# Patient Record
Sex: Female | Born: 1975 | Race: Black or African American | Hispanic: No | Marital: Single | Smoking: Never smoker
Health system: Southern US, Community
[De-identification: ages and names within clinical notes are randomized; demographics above are authoritative.]

## PROBLEM LIST (undated history)

## (undated) DIAGNOSIS — IMO0001 Reserved for inherently not codable concepts without codable children: Secondary | ICD-10-CM

## (undated) DIAGNOSIS — E559 Vitamin D deficiency, unspecified: Secondary | ICD-10-CM

## (undated) DIAGNOSIS — L680 Hirsutism: Secondary | ICD-10-CM

## (undated) DIAGNOSIS — E538 Deficiency of other specified B group vitamins: Secondary | ICD-10-CM

## (undated) DIAGNOSIS — N946 Dysmenorrhea, unspecified: Secondary | ICD-10-CM

## (undated) DIAGNOSIS — D649 Anemia, unspecified: Secondary | ICD-10-CM

## (undated) DIAGNOSIS — N920 Excessive and frequent menstruation with regular cycle: Secondary | ICD-10-CM

## (undated) DIAGNOSIS — E669 Obesity, unspecified: Secondary | ICD-10-CM

## (undated) HISTORY — DX: Deficiency of other specified B group vitamins: E53.8

## (undated) HISTORY — DX: Dysmenorrhea, unspecified: N94.6

## (undated) HISTORY — DX: Vitamin D deficiency, unspecified: E55.9

## (undated) HISTORY — DX: Anemia, unspecified: D64.9

## (undated) HISTORY — DX: Hirsutism: L68.0

## (undated) HISTORY — DX: Obesity, unspecified: E66.9

## (undated) HISTORY — DX: Excessive and frequent menstruation with regular cycle: N92.0

## (undated) HISTORY — DX: Reserved for inherently not codable concepts without codable children: IMO0001

## (undated) HISTORY — PX: TONSILLECTOMY: SUR1361

---

## 2006-07-21 ENCOUNTER — Ambulatory Visit: Payer: Self-pay | Admitting: Family Medicine

## 2006-09-10 ENCOUNTER — Ambulatory Visit (HOSPITAL_COMMUNITY): Admission: RE | Admit: 2006-09-10 | Discharge: 2006-09-10 | Payer: Self-pay | Admitting: Obstetrics and Gynecology

## 2006-09-18 ENCOUNTER — Encounter: Admission: RE | Admit: 2006-09-18 | Discharge: 2006-10-28 | Payer: Self-pay | Admitting: Surgery

## 2006-09-24 ENCOUNTER — Ambulatory Visit (HOSPITAL_COMMUNITY): Admission: RE | Admit: 2006-09-24 | Discharge: 2006-09-24 | Payer: Self-pay | Admitting: Surgery

## 2006-11-13 ENCOUNTER — Ambulatory Visit (HOSPITAL_COMMUNITY): Admission: RE | Admit: 2006-11-13 | Discharge: 2006-11-13 | Payer: Self-pay | Admitting: Surgery

## 2007-01-27 HISTORY — PX: GASTRIC BYPASS: SHX52

## 2007-02-03 ENCOUNTER — Encounter: Admission: RE | Admit: 2007-02-03 | Discharge: 2007-05-04 | Payer: Self-pay | Admitting: Surgery

## 2007-02-06 ENCOUNTER — Ambulatory Visit (HOSPITAL_COMMUNITY): Admission: RE | Admit: 2007-02-06 | Discharge: 2007-02-06 | Payer: Self-pay | Admitting: Obstetrics and Gynecology

## 2007-02-16 DIAGNOSIS — Z9884 Bariatric surgery status: Secondary | ICD-10-CM | POA: Insufficient documentation

## 2007-02-17 ENCOUNTER — Inpatient Hospital Stay (HOSPITAL_COMMUNITY): Admission: RE | Admit: 2007-02-17 | Discharge: 2007-02-19 | Payer: Self-pay | Admitting: Surgery

## 2007-02-18 ENCOUNTER — Encounter (INDEPENDENT_AMBULATORY_CARE_PROVIDER_SITE_OTHER): Payer: Self-pay | Admitting: Surgery

## 2007-03-26 ENCOUNTER — Ambulatory Visit: Payer: Self-pay | Admitting: Physician Assistant

## 2007-05-20 ENCOUNTER — Encounter: Admission: RE | Admit: 2007-05-20 | Discharge: 2007-08-18 | Payer: Self-pay | Admitting: Surgery

## 2007-10-15 ENCOUNTER — Encounter: Admission: RE | Admit: 2007-10-15 | Discharge: 2007-11-18 | Payer: Self-pay | Admitting: Surgery

## 2008-12-25 IMAGING — CR DG CHEST 2V
2 series · 2 of 2 positions shown · non-contrast
Comparison: No prior studies are available for comparison purposes.

CLINICAL DATA: Bariatric screening; preoperative respiratory exam.
 CHEST - 2 VIEW:

[view not recorded (1 of 2)]
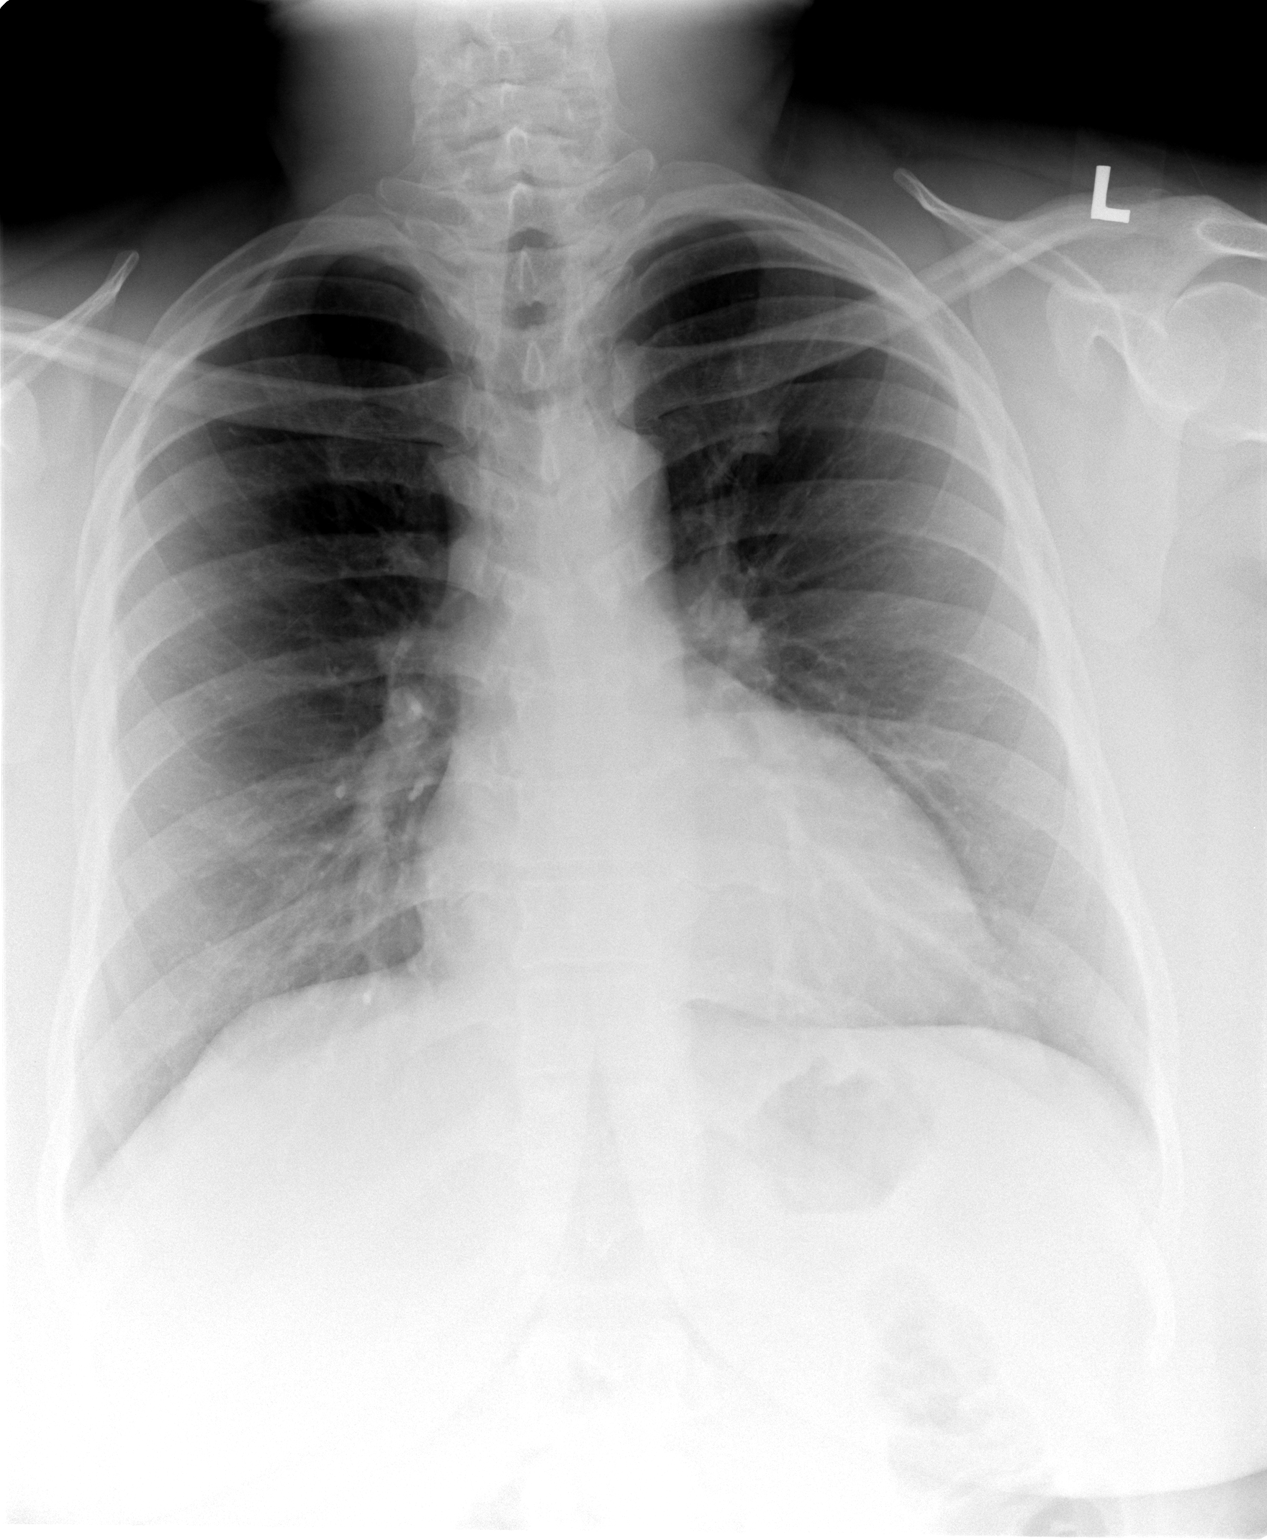

[view not recorded (2 of 2)]
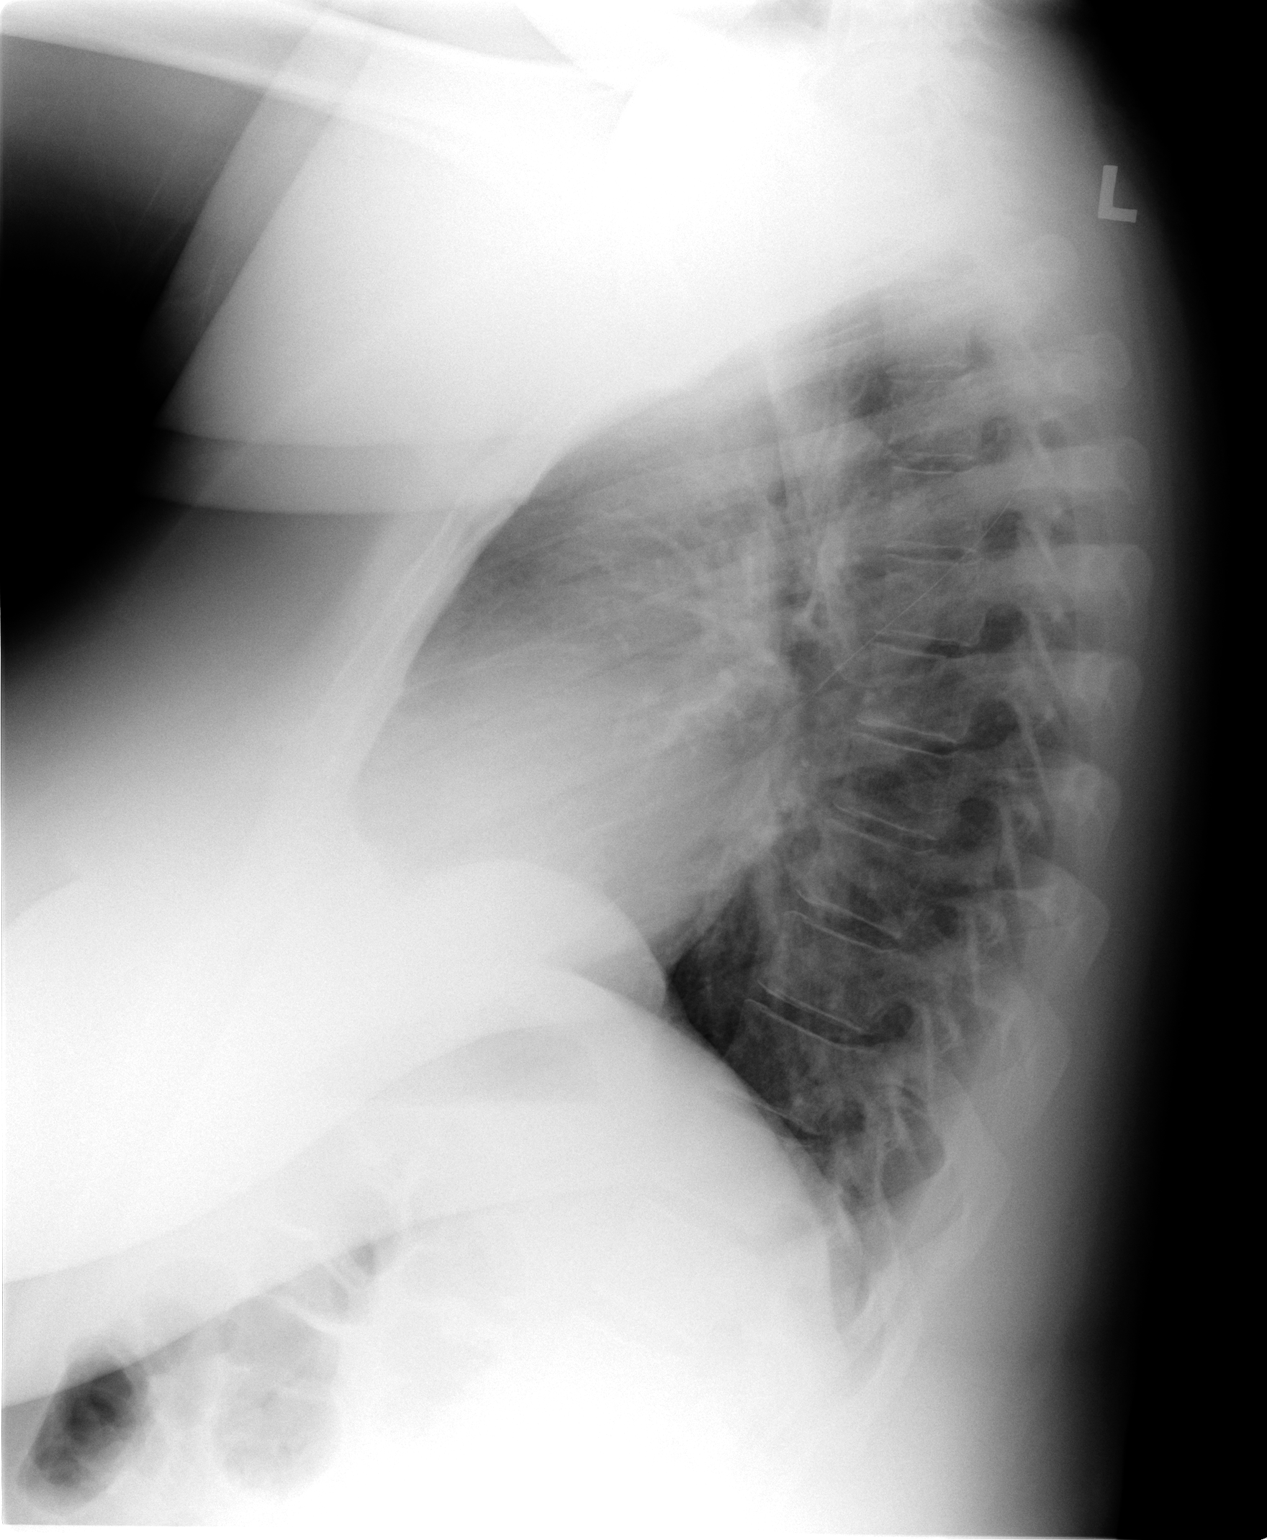

[2 of 2 positions shown; findings below may reference images not displayed]

FINDINGS: The cardiac silhouette, mediastinum, and pulmonary vasculature are within normal limits. Both lungs are clear. Hypertrophic degenerative changes are seen at the junction of the first rib and sternum on the left. The osseous structures are otherwise unremarkable.
IMPRESSION: There is no evidence of acute cardiac or pulmonary process.

## 2010-07-11 NOTE — Op Note (Signed)
NAME:  Jenny Scott, Jenny Scott NO.:  [id number]   MEDICAL RECORD NO.:  [id number]          PATIENT TYPE:  INP   LOCATION:  1523                         FACILITY:  [facility]   PHYSICIAN:  Sandria Bales. Ezzard Standing, M.D.  DATE OF BIRTH:  [birth year]   DATE OF PROCEDURE:  02/17/2007  DATE OF DISCHARGE:                               OPERATIVE REPORT   PREOPERATIVE DIAGNOSIS:  Morbid obesity - weight of 307, BMI of 50.6.   POSTOPERATIVE DIAGNOSIS:  Morbid obesity - weight of 307, BMI of 50.6.   PROCEDURE:  Laparoscopic Roux-en-Y gastric bypass (antecolic,  antegastric), upper endoscopy by Dr. Wenda Low.   SURGEON:  Dr. Ezzard Standing.   FIRST ASSISTANT:  Dr. Wenda Low.   ANESTHESIA:  General endotracheal with approximately 30 mL of 0.25%  Marcaine.   COMPLICATIONS:  None.   PROCEDURE:  Jenny Scott is a 35 year old, black female who is a patient  of Dr. Alba Cory of [place] who has been morbidly obese much of  her adult life. When I initially saw her in July her weight was 307, her  BMI of 50.6. She successfully lost down to a weight of 290 with a BMI of  47.8 in the preoperative time frame.   She is interested in Roux-en-Y gastric bypass and I discussed with her  the indications and potential complications of gastric bypass. The  potential complications include, but are not limited to, bleeding,  infection, bowel leak, deep venous thrombosis and long-term nutritional  consequences.  She also understands the risk of open surgery.   DESCRIPTION OF PROCEDURE:  The patient in a supine position, given a  general endotracheal anesthetic.  She was given 2 grams cefoxitin  at  the initiation of the procedure, had PAS stockings in place, a Foley  catheter in place. Her abdomen was prepped with Betadine solution and  sterilely draped.  A time out was held identifying the patient and  procedure.   I accessed her abdomen with an OptiView Ethicon 11 mm trocar through the  left upper  quadrant.  I then did abdominal exploration. The right and  left lobes of her liver were unremarkable.  She had some of scar tissue  of omentum along the left upper quadrant which I ended up taking down  with scissors. This just appeared to be some scar tissue not related to  any injury or problems. The remainder of her abdomen was unremarkable.  The anterior wall of her stomach was unremarkable except she also had  some funny adhesions between the lesser curvature of the liver which I  also took down with dissection.   I then placed 6 additional trocars, a 5-mm subxiphoid for the liver, a  12-mm right subcostal, a 12-mm right paramedian, an 11-mm to the right  of the umbilicus, an 11-mm left paramedian trocar and a 5-mm left  lateral trocar.   My first identification was to find the small bowel and I found the  ligament of Trietz, count about 40 cm and divided the small bowel with a  white load of the Endo-GIA 45 stapler. I took the  mesentery down also.   I then placed a Penrose drain on the future gastric limb and then  counted 100 cm of small bowel for the bypass portion.   I then did a side-to-side jejunojejunal anastomosis with a 45-mm white  load and then closed the enterotomy with two running 2-0 Vicryl sutures.  I placed an extra buttressing suture. I then closed the mesenteric  defect with a 2-0 silk suture and placed laparo-ties on both ends.   I then placed Tisseel over the jejunojejunostomy.   I then placed the liver retractor retracting the left lobe of liver.  There were adhesions between the undersurface of the liver and the  lesser curvature of the stomach which I took down with sharp  laparoscopic dissection. I then identified the angle of Hiss which I  opened up and then went along the lesser curve approximately 5 cm and  got into a window behind the stomach into the lesser sac.   My first firing to divide the stomach was a 45 Ethicon blue load across  the  stomach. I then did two firings of the 60 mm Ethicon blue load and  then a final firing of the 45 mm Ethicon blue load creating a pouch  approximately 5 cm in length x 3 cm in width.   I had to place a couple clips along the greater curvature on a bleeding  vessel.  I placed some Tisseel along the greater curvature of the new  stomach pouch.  I then oversewed the distal gastric remnant with locking  2-0 Vicryl sutures.   I then sewed the gastric pouch to the jejunal limb that I brought  antecolic and antegastric and did a side to side anastomosis with a  posterior layer of 2-0 Vicryl. Then I put a staple of the 45 mm Ethicon  Blue load trying to create about a 2 cm gastrojejunostomp.  I closed the  enterotomy with two 2-0 Vicryl sutures and then an anterior 2-0 Vicryl  layer up here.   I looked for Peterson's defect but I thought there was little I could  put suture material in there. I thought my anastomosis was under no  tension. Dr. Daphine Deutscher then broke scrub. He went and did an upper  endoscopy.   He will dictate this portion. On endoscopy we looked for no bleeding,  the gastric mucosal lining and the patency of the anastomosis. The  esophagogastric junction was about 40 cm with a gastrojejunal  anastomosis at about 45 cm, so the patient had a 5 cm pouch.   I irrigated the upper abdomen while Dr. Daphine Deutscher did the upper endoscopy  and saw no evidence of any leak from the gastrojejunostomy.   I placed Tisseel over the gastrojejunostomy and Tisseel over the jejunal  little limb.  There was no tension on this anastomosis. All the bowel  lay well and the wounds looked good. I then removed the trocars in turn  after the liver retractor had been removed. I closed the skin at each  site with a 5-0 Monocryl suture, painted each wound with tincture of  Benzoin and steri-stripped it.   I injected about 25 mL of 0.25% Marcaine and the patient tolerated the  procedure well and was transported  to the recovery room in good  condition. Sponge and needle count were correct at the end of the case.  The patient was transferred to the recovery room in good condition.      Sandria Bales.  Ezzard Standing, M.D.  Electronically Signed     DHN/MEDQ  D:  02/17/2007  T:  02/17/2007  Job:  [id number]   cc:   Alba Cory, MD

## 2010-08-16 LAB — HM PAP SMEAR: HM PAP: NORMAL

## 2010-08-16 LAB — LIPID PANEL
CHOLESTEROL: 116 mg/dL (ref 0–200)
HDL: 62 mg/dL (ref 35–70)
LDL CALC: 45 mg/dL
Triglycerides: 45 mg/dL (ref 40–160)

## 2010-08-28 ENCOUNTER — Ambulatory Visit: Payer: Self-pay | Admitting: Oncology

## 2010-09-27 ENCOUNTER — Ambulatory Visit: Payer: Self-pay | Admitting: Oncology

## 2010-12-01 LAB — BASIC METABOLIC PANEL
BUN: 26 — ABNORMAL HIGH
Calcium: 9.7
Chloride: 100
Creatinine, Ser: 0.84
Sodium: 135

## 2010-12-01 LAB — DIFFERENTIAL
Basophils Absolute: 0
Basophils Absolute: 0
Eosinophils Absolute: 0 — ABNORMAL LOW
Eosinophils Absolute: 0 — ABNORMAL LOW
Eosinophils Relative: 0
Monocytes Absolute: 0.6
Neutro Abs: 4.7
Neutrophils Relative %: 71

## 2010-12-01 LAB — CBC
HCT: 34.3 — ABNORMAL LOW
HCT: 34.7 — ABNORMAL LOW
MCV: 77 — ABNORMAL LOW
MCV: 77.3 — ABNORMAL LOW
Platelets: 185
Platelets: 203
RBC: 4.49
RDW: 16.4 — ABNORMAL HIGH
WBC: 7.7

## 2010-12-01 LAB — HEMOGLOBIN AND HEMATOCRIT, BLOOD
HCT: 33.5 — ABNORMAL LOW
HCT: 38.9
Hemoglobin: 11.3 — ABNORMAL LOW
Hemoglobin: 12.7

## 2010-12-01 LAB — HCG, SERUM, QUALITATIVE: Preg, Serum: NEGATIVE

## 2013-02-03 ENCOUNTER — Ambulatory Visit: Payer: Self-pay | Admitting: Oncology

## 2013-08-22 DIAGNOSIS — Z8719 Personal history of other diseases of the digestive system: Secondary | ICD-10-CM | POA: Insufficient documentation

## 2013-08-23 DIAGNOSIS — K562 Volvulus: Secondary | ICD-10-CM | POA: Insufficient documentation

## 2013-08-23 DIAGNOSIS — K4030 Unilateral inguinal hernia, with obstruction, without gangrene, not specified as recurrent: Secondary | ICD-10-CM | POA: Insufficient documentation

## 2013-08-26 HISTORY — PX: EXPLORATORY LAPAROTOMY: SUR591

## 2014-08-31 ENCOUNTER — Telehealth: Payer: Self-pay

## 2014-08-31 ENCOUNTER — Telehealth: Payer: Self-pay | Admitting: Family Medicine

## 2014-08-31 MED ORDER — NORGESTIM-ETH ESTRAD TRIPHASIC 0.18/0.215/0.25 MG-25 MCG PO TABS: 1.0000 | ORAL_TABLET | Freq: Every day | ORAL | Status: DC

## 2014-08-31 NOTE — Telephone Encounter (Signed)
ERRENOUS °

## 2014-08-31 NOTE — Telephone Encounter (Signed)
PT NEEDS A NEW RX FOR  90 DAY SUPPLY OF HER BIRTH CONTROL TO BE CALLED INTO [facility]. HAS TO BE THE ORTHO TRI CYCLEN LOW BRAND. THINKS IT IS NORGESTROENE.

## 2014-08-31 NOTE — Telephone Encounter (Signed)
She needs a CPE in December

## 2014-09-01 NOTE — Telephone Encounter (Signed)
Pt will call back to reschedule her physical appointment.

## 2014-09-03 ENCOUNTER — Telehealth: Payer: Self-pay

## 2014-09-03 NOTE — Telephone Encounter (Signed)
Medication was sent to wrong pharmacy, [facility] [address]. Please send over to pharmacy.

## 2014-09-04 MED ORDER — NORGESTIM-ETH ESTRAD TRIPHASIC 0.18/0.215/0.25 MG-25 MCG PO TABS: 1.0000 | ORAL_TABLET | Freq: Every day | ORAL | Status: DC

## 2014-09-04 NOTE — Telephone Encounter (Signed)
She needs follow up to repeat labs

## 2014-09-14 NOTE — Telephone Encounter (Signed)
[facility] [address]

## 2014-09-14 NOTE — Telephone Encounter (Signed)
Had spoken with patient and she will call back to sch appointment

## 2014-10-24 ENCOUNTER — Encounter: Payer: Self-pay | Admitting: Family Medicine

## 2014-10-24 DIAGNOSIS — E538 Deficiency of other specified B group vitamins: Secondary | ICD-10-CM | POA: Insufficient documentation

## 2014-10-24 DIAGNOSIS — E663 Overweight: Secondary | ICD-10-CM | POA: Insufficient documentation

## 2014-10-24 DIAGNOSIS — E559 Vitamin D deficiency, unspecified: Secondary | ICD-10-CM | POA: Insufficient documentation

## 2014-10-24 DIAGNOSIS — D508 Other iron deficiency anemias: Secondary | ICD-10-CM | POA: Insufficient documentation

## 2014-10-24 DIAGNOSIS — L689 Hypertrichosis, unspecified: Secondary | ICD-10-CM | POA: Insufficient documentation

## 2014-10-27 ENCOUNTER — Encounter: Payer: Self-pay | Admitting: Family Medicine

## 2014-10-27 ENCOUNTER — Ambulatory Visit (INDEPENDENT_AMBULATORY_CARE_PROVIDER_SITE_OTHER): Payer: BLUE CROSS/BLUE SHIELD | Admitting: Family Medicine

## 2014-10-27 VITALS — BP 126/84 | HR 82 | Temp 98.3°F | Resp 18 | Ht 65.0 in | Wt 172.9 lb

## 2014-10-27 DIAGNOSIS — Z01419 Encounter for gynecological examination (general) (routine) without abnormal findings: Secondary | ICD-10-CM

## 2014-10-27 DIAGNOSIS — Z9884 Bariatric surgery status: Secondary | ICD-10-CM

## 2014-10-27 DIAGNOSIS — Z23 Encounter for immunization: Secondary | ICD-10-CM

## 2014-10-27 DIAGNOSIS — Z114 Encounter for screening for human immunodeficiency virus [HIV]: Secondary | ICD-10-CM

## 2014-10-27 DIAGNOSIS — L989 Disorder of the skin and subcutaneous tissue, unspecified: Secondary | ICD-10-CM

## 2014-10-27 DIAGNOSIS — L700 Acne vulgaris: Secondary | ICD-10-CM

## 2014-10-27 DIAGNOSIS — Z1239 Encounter for other screening for malignant neoplasm of breast: Secondary | ICD-10-CM | POA: Diagnosis not present

## 2014-10-27 DIAGNOSIS — Z124 Encounter for screening for malignant neoplasm of cervix: Secondary | ICD-10-CM

## 2014-10-27 DIAGNOSIS — Z7189 Other specified counseling: Secondary | ICD-10-CM

## 2014-10-27 DIAGNOSIS — Z0000 Encounter for general adult medical examination without abnormal findings: Secondary | ICD-10-CM | POA: Diagnosis not present

## 2014-10-27 DIAGNOSIS — Z13220 Encounter for screening for lipoid disorders: Secondary | ICD-10-CM

## 2014-10-27 DIAGNOSIS — Z719 Counseling, unspecified: Secondary | ICD-10-CM

## 2014-10-27 MED ORDER — TRETINOIN 0.05 % EX CREA: TOPICAL_CREAM | Freq: Every day | CUTANEOUS | Status: DC

## 2014-10-27 NOTE — Progress Notes (Signed)
Name: Jenny Scott   MRN: [id number]    DOB: [birth year]   Date:10/27/2014       Progress Note  Subjective  Chief Complaint  Chief Complaint  Patient presents with  . Annual Exam  . Acne    HPI  Well woman: she has been sexually active now, but dating same person past 2 years. Taking ocp's and causes her to gain some weight. They also use condoms. She has noticed vaginal odor, but has improved since she stopped spermicide. She douches. Discussed USPTF and she does not want to start mammogram until age 39 yo.   Acne: over the past month breaking out and otc medication is not helping   Patient Active Problem List   Diagnosis Date Noted  . Anemia, due to inadequate iron intake 10/24/2014  . B12 deficiency 10/24/2014  . 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 Overweight 10/24/2014  . Vitamin D deficiency 10/24/2014  . History of small bowel obstruction 08/22/2013  . History of bariatric surgery 02/16/2007    Past Surgical History  Procedure Laterality Date  . Gastric bypass  01/2007  . Cesarean section  1990  . Exploratory laparotomy  08/2013    at [facility], had a leak, a volvulos and hernia repair  . Tonsillectomy      Family History  Problem Relation Age of Onset  . COPD Mother   . Heart disease Mother     CHF  . CAD Mother     Social History   Social History  . Marital Status: Single    Spouse Name: N/A  . Number of Children: N/A  . Years of Education: N/A   Occupational History  . Not on file.   Social History Main Topics  . Smoking status: Never Smoker   . Smokeless tobacco: Never Used  . Alcohol Use: No  . Drug Use: No  . Sexual Activity: Yes   Other Topics Concern  . Not on file   Social History Narrative     Current outpatient prescriptions:  .  Cyanocobalamin (B-12) 1000 MCG SUBL, Place 1 tablet under the tongue daily., Disp: , Rfl:  .  calcium citrate-vitamin D (CALCIUM CITRATE +) 315-200 MG-UNIT per tablet, Take 1 tablet by mouth daily., Disp: ,  Rfl:  .  Cholecalciferol (VITAMIN D3) 2000 UNITS capsule, Take 1 capsule by mouth daily., Disp: , Rfl:  .  ferrous sulfate 325 (65 FE) MG EC tablet, Take 1 tablet by mouth daily., Disp: , Rfl:  .  Multiple Vitamin (MULTI-VITAMINS) TABS, Take 1 tablet by mouth daily., Disp: , Rfl:  .  Norgestimate-Ethinyl Estradiol Triphasic 0.18/0.215/0.25 MG-25 MCG tab, Take 1 tablet by mouth daily., Disp: 3 Package, Rfl: 0 .  tretinoin (RETIN-A) 0.05 % cream, Apply topically at bedtime., Disp: 45 g, Rfl: 2  No Known Allergies   ROS  Constitutional: Negative for fever positive for  weight change.  Respiratory: Negative for cough and shortness of breath.   Cardiovascular: Negative for chest pain or palpitations.  Gastrointestinal: Negative for abdominal pain, no bowel changes.  Musculoskeletal: Negative for gait problem or joint swelling.  Skin: Negative for rash. Positive for acne Neurological: Negative for dizziness or headache.  No other specific complaints in a complete review of systems (except as listed in HPI above).  Objective  Filed Vitals:   10/27/14 1142  BP: 126/84  Pulse: 82  Temp: 98.3 F (36.8 C)  TempSrc: Oral  Resp: 18  Height: 5\' 5"  (1.651 m)  Weight: 172  lb 14.4 oz (78.427 kg)  SpO2: 99%    Body mass index is 28.77 kg/(m^2).  Physical Exam  Constitutional: Patient appears well-developed and well-nourished. No distress.  HENT: Head: Normocephalic and atraumatic. Ears: B TMs ok, no erythema or effusion; Nose: Nose normal. Mouth/Throat: Oropharynx is clear and moist. No oropharyngeal exudate.  Eyes: Conjunctivae and EOM are normal. Pupils are equal, round, and reactive to light. No scleral icterus.  Neck: Normal range of motion. Neck supple. No JVD present. No thyromegaly present.  Cardiovascular: Normal rate, regular rhythm and normal heart sounds.  No murmur heard. No BLE edema. Pulmonary/Chest: Effort normal and breath sounds normal. No respiratory distress. Abdominal:  Soft. Bowel sounds are normal, no distension. There is no tenderness. no masses . 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 No gross deformities Neurological: he is alert and oriented to person, place, and time. No cranial nerve deficit. Coordination, balance, strength, speech and gait are normal.  Skin: Skin is warm and dry. No rash noted. No erythema.  she has an atypical mole 3 x 3 mm on right upper back and also comedones on her face Psychiatric: Patient has a normal mood and affect. behavior is normal. Judgment and thought content normal.  PHQ2/9: Depression screen PHQ 2/9 10/27/2014  Decreased Interest 0  Down, Depressed, Hopeless 0  PHQ - 2 Score 0     Fall Risk: Fall Risk  10/27/2014  Falls in the past year? No      Assessment & Plan  1. Well woman exam  2. Need for influenza vaccination  - Flu Vaccine QUAD 36+ mos PF IM (Fluarix & Fluzone Quad PF)  3. History of bariatric surgery  - Ferritin - CBC with Differential/Platelet - Comprehensive metabolic panel - Vit D  25 hydroxy (rtn osteoporosis monitoring) - Vitamin B12  4. Cervical cancer screening  - Pap IG, CT/NG NAA, and HPV (high risk)  5. Breast cancer screening USPTF , she wants to hold off until age 39  6. Health counseling Discussed importance of 150 minutes of physical activity weekly, eat two servings of fish weekly, eat one serving of tree nuts ( cashews, pistachios, pecans, almonds.Marland Kitchen) every other day, eat 6 servings of fruit/vegetables daily and drink plenty of water and  avoid sweet beverages.   7. Lipid screening  - Lipid panel  8. Encounter for screening for HIV  - HIV antibody  9. Acne vulgaris  - tretinoin (RETIN-A) 0.05 % cream; Apply topically at bedtime.  Dispense: 45 g; Refill: 2   10. Skin lesion on examination Patient refused to have a biopsy

## 2014-10-27 NOTE — Patient Instructions (Signed)
Discussed importance of 150 minutes of physical activity weekly, eat two servings of fish weekly, eat one serving of tree nuts ( cashews, pistachios, pecans, almonds..) every other day, eat 6 servings of fruit/vegetables daily and drink plenty of water and avoid sweet beverages. 

## 2014-10-28 LAB — CBC WITH DIFFERENTIAL/PLATELET
BASOS ABS: 0 10*3/uL (ref 0.0–0.2)
Basos: 1 %
EOS (ABSOLUTE): 0.1 10*3/uL (ref 0.0–0.4)
Eos: 1 %
HEMOGLOBIN: 10 g/dL — AB (ref 11.1–15.9)
Hematocrit: 33.6 % — ABNORMAL LOW (ref 34.0–46.6)
IMMATURE GRANS (ABS): 0 10*3/uL (ref 0.0–0.1)
IMMATURE GRANULOCYTES: 0 %
LYMPHS: 39 %
Lymphocytes Absolute: 2 10*3/uL (ref 0.7–3.1)
MCH: 21.1 pg — AB (ref 26.6–33.0)
MCHC: 29.8 g/dL — ABNORMAL LOW (ref 31.5–35.7)
MCV: 71 fL — ABNORMAL LOW (ref 79–97)
MONOCYTES: 9 %
Monocytes Absolute: 0.5 10*3/uL (ref 0.1–0.9)
NEUTROS ABS: 2.5 10*3/uL (ref 1.4–7.0)
NEUTROS PCT: 50 %
PLATELETS: 327 10*3/uL (ref 150–379)
RBC: 4.73 x10E6/uL (ref 3.77–5.28)
RDW: 18 % — ABNORMAL HIGH (ref 12.3–15.4)
WBC: 5 10*3/uL (ref 3.4–10.8)

## 2014-10-28 LAB — LIPID PANEL
Chol/HDL Ratio: 1.7 ratio units (ref 0.0–4.4)
Cholesterol, Total: 143 mg/dL (ref 100–199)
HDL: 83 mg/dL (ref >39)
LDL Calculated: 49 mg/dL (ref 0–99)
TRIGLYCERIDES: 53 mg/dL (ref 0–149)
VLDL Cholesterol Cal: 11 mg/dL (ref 5–40)

## 2014-10-28 LAB — COMPREHENSIVE METABOLIC PANEL
A/G RATIO: 1.7 (ref 1.1–2.5)
ALT: 24 IU/L (ref 0–32)
AST: 31 IU/L (ref 0–40)
Albumin: 4.2 g/dL (ref 3.5–5.5)
Alkaline Phosphatase: 53 IU/L (ref 39–117)
BUN/Creatinine Ratio: 18 (ref 9–23)
BUN: 14 mg/dL (ref 6–24)
Bilirubin Total: 0.4 mg/dL (ref 0.0–1.2)
CALCIUM: 9.3 mg/dL (ref 8.7–10.2)
CO2: 26 mmol/L (ref 18–29)
CREATININE: 0.77 mg/dL (ref 0.57–1.00)
Chloride: 102 mmol/L (ref 97–108)
GFR, EST AFRICAN AMERICAN: 112 mL/min/{1.73_m2} (ref >59)
GFR, EST NON AFRICAN AMERICAN: 97 mL/min/{1.73_m2} (ref >59)
GLUCOSE: 79 mg/dL (ref 65–99)
Globulin, Total: 2.5 g/dL (ref 1.5–4.5)
Potassium: 4.1 mmol/L (ref 3.5–5.2)
Sodium: 141 mmol/L (ref 134–144)
TOTAL PROTEIN: 6.7 g/dL (ref 6.0–8.5)

## 2014-10-28 LAB — FERRITIN: FERRITIN: 6 ng/mL — AB (ref 15–150)

## 2014-10-28 LAB — VITAMIN B12

## 2014-10-28 LAB — VITAMIN D 25 HYDROXY (VIT D DEFICIENCY, FRACTURES): Vit D, 25-Hydroxy: 44.1 ng/mL (ref 30.0–100.0)

## 2014-10-28 LAB — HIV ANTIBODY (ROUTINE TESTING W REFLEX): HIV SCREEN 4TH GENERATION: NONREACTIVE

## 2014-10-28 NOTE — Progress Notes (Signed)
Patient notified

## 2014-10-29 NOTE — Progress Notes (Signed)
Patient notified

## 2014-10-30 LAB — PAP IG, CT-NG NAA, HPV HIGH-RISK
HPV, high-risk: NEGATIVE
PAP Smear Comment: 0

## 2014-11-02 ENCOUNTER — Telehealth: Payer: Self-pay

## 2014-11-02 NOTE — Telephone Encounter (Signed)
Left Vm for patient to return my call.  

## 2014-11-02 NOTE — Progress Notes (Signed)
Patient notified

## 2014-11-02 NOTE — Telephone Encounter (Signed)
-----   Message from Alba Cory, MD sent at 11/01/2014  9:55 AM EDT ----- Normal pap smear , negative HPV

## 2015-05-11 ENCOUNTER — Other Ambulatory Visit: Payer: Self-pay | Admitting: Family Medicine

## 2015-05-12 ENCOUNTER — Other Ambulatory Visit: Payer: Self-pay | Admitting: Family Medicine

## 2015-05-12 MED ORDER — NORGESTIM-ETH ESTRAD TRIPHASIC 0.18/0.215/0.25 MG-25 MCG PO TABS: 1.0000 | ORAL_TABLET | Freq: Every day | ORAL | Status: DC

## 2015-05-12 NOTE — Telephone Encounter (Signed)
Patient requesting refill. 

## 2015-05-12 NOTE — Telephone Encounter (Signed)
NEEDS REFILL ON BIRTH CONTROL . [facility] [address]

## 2015-05-12 NOTE — Telephone Encounter (Signed)
Refill request was sent to Dr. Krichna Sowles for approval and submission.  

## 2015-06-09 ENCOUNTER — Other Ambulatory Visit: Payer: Self-pay

## 2015-06-09 DIAGNOSIS — L700 Acne vulgaris: Secondary | ICD-10-CM

## 2015-06-09 NOTE — Telephone Encounter (Signed)
Patient requesting refill. 

## 2015-06-13 NOTE — Telephone Encounter (Signed)
Left message for patient to schedule appointment.

## 2015-06-22 ENCOUNTER — Ambulatory Visit (INDEPENDENT_AMBULATORY_CARE_PROVIDER_SITE_OTHER): Payer: Managed Care, Other (non HMO) | Admitting: Family Medicine

## 2015-06-22 ENCOUNTER — Encounter: Payer: Self-pay | Admitting: Family Medicine

## 2015-06-22 VITALS — BP 116/74 | HR 87 | Temp 98.2°F | Resp 16 | Ht 65.0 in | Wt 168.7 lb

## 2015-06-22 DIAGNOSIS — Z9884 Bariatric surgery status: Secondary | ICD-10-CM

## 2015-06-22 DIAGNOSIS — L700 Acne vulgaris: Secondary | ICD-10-CM | POA: Diagnosis not present

## 2015-06-22 DIAGNOSIS — D509 Iron deficiency anemia, unspecified: Secondary | ICD-10-CM

## 2015-06-22 MED ORDER — FERROUS SULFATE 300 (60 FE) MG/5ML PO SYRP: 300.0000 mg | ORAL_SOLUTION | Freq: Every day | ORAL | Status: DC

## 2015-06-22 MED ORDER — TRETINOIN 0.05 % EX CREA: TOPICAL_CREAM | Freq: Every day | CUTANEOUS | Status: DC

## 2015-06-22 NOTE — Progress Notes (Signed)
Name: Jenny Scott   MRN: [id number]    DOB: November 25, 1975   Date:06/22/2015       Progress Note  Subjective  Chief Complaint  Chief Complaint  Patient presents with  . 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 Cycles are not very heavy, takes ocp's. She has been able to tolerate liquid iron supplementation, taking 10 ml daily and denies associated constipation. She denies fatigue or SOB, feeling much better.   Acne vulgaris: she has been out of Retin A and is noticing recurrence of acne and would like a refill.   Obesity: she had bariatric surgery in 2008, she is down from 316 lbs to 168 lbs she is doing great, lost 4 lbs since last visit. She eats balanced diet and also exercises at least 3-4 times weekly    Patient Active Problem List   Diagnosis Date Noted  . Skin lesion on examination 10/27/2014  . Anemia, due to inadequate iron intake 10/24/2014  . B12 deficiency 10/24/2014  . 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 Overweight 10/24/2014  . Vitamin D deficiency 10/24/2014  . History of small bowel obstruction 08/22/2013  . History of bariatric surgery 02/16/2007    Past Surgical History  Procedure Laterality Date  . Gastric bypass  01/2007  . Cesarean section  1990  . Exploratory laparotomy  08/2013    [facility], had a leak, a volvulos and hernia repair  . Tonsillectomy      Family History  Problem Relation Age of Onset  . COPD Mother   . Heart disease Mother     CHF  . CAD Mother     Social History   Social History  . Marital Status: Single    Spouse Name: N/A  . Number of Children: N/A  . Years of Education: N/A   Occupational History  . Not on file.   Social History Main Topics  . Smoking status: Never Smoker   . Smokeless tobacco: Never Used  . Alcohol  Use: No  . Drug Use: No  . Sexual Activity: Yes   Other Topics Concern  . Not on file   Social History Narrative     Current outpatient prescriptions:  .  calcium citrate-vitamin D (CALCIUM CITRATE +) 315-200 MG-UNIT per tablet, Take 1 tablet by mouth daily., Disp: , Rfl:  .  Cholecalciferol (VITAMIN D3) 2000 UNITS capsule, Take 1 capsule by mouth daily., Disp: , Rfl:  .  Cyanocobalamin (B-12) 1000 MCG SUBL, Place 1 tablet under the tongue daily., Disp: , Rfl:  .  ferrous sulfate 300 (60 Fe) MG/5ML syrup, Take 5 mLs (300 mg total) by mouth daily., Disp: 150 mL, Rfl: 3 .  Multiple Vitamin (MULTI-VITAMINS) TABS, Take 1 tablet by mouth daily., Disp: , Rfl:  .  Norgestimate-Ethinyl Estradiol Triphasic 0.18/0.215/0.25 MG-25 MCG tab, Take 1 tablet by mouth daily., Disp: 3 Package, Rfl: 1 .  tretinoin (RETIN-A) 0.05 % cream, Apply topically at bedtime., Disp: 45 g, Rfl: 2  No Known Allergies   ROS  Constitutional: Negative for fever , positive for mild weight change.  Respiratory: Negative for cough and shortness of breath.   Cardiovascular: Negative for chest pain or palpitations.  Gastrointestinal: Negative for abdominal pain, no bowel changes. She  has some dumping syndrome if she eats junk Musculoskeletal: Negative for gait problem or joint swelling.  Skin: Negative for rash. Acne present Neurological: Negative for dizziness or headache.  No other specific complaints in a complete review of systems (except as listed in HPI above). Objective  Filed Vitals:   06/22/15 1352  BP: 116/74  Pulse: 87  Temp: 98.2 F (36.8 C)  TempSrc: Oral  Resp: 16  Height:  (1.651 m)  Weight: 168 lb 11.2 oz (76.522 kg)  SpO2: 96%    Body mass index is 28.07 kg/(m^2).  Physical Exam  Constitutional: Patient appears well-developed and well-nourished.  No distress.  HEENT: head atraumatic, normocephalic, pupils equal and reactive to light, neck supple, throat within normal  limits Cardiovascular: Normal rate, regular rhythm and normal heart sounds.  No murmur heard. No BLE edema. Pulmonary/Chest: Effort normal and breath sounds normal. No respiratory distress. Abdominal: Soft.  There is no tenderness. Skin: mild facial acne Psychiatric: Patient has a normal mood and affect. behavior is normal. Judgment and thought content normal.   PHQ2/9: Depression screen [facility] 2/9 06/22/2015 10/27/2014  Decreased Interest 0 0  Down, Depressed, Hopeless 0 0  PHQ - 2 Score 0 0     Fall Risk: Fall Risk  06/22/2015 10/27/2014  Falls in the past year? No No    Functional Status Survey: Is the patient deaf or have difficulty hearing?: No Does the patient have difficulty seeing, even when wearing glasses/contacts?: No Does the patient have difficulty concentrating, remembering, or making decisions?: No Does the patient have difficulty walking or climbing stairs?: No Does the patient have difficulty dressing or bathing?: No Does the patient have difficulty doing errands alone such as visiting a doctor's office or shopping?: No    Assessment & Plan  1. Acne vulgaris  - tretinoin (RETIN-A) 0.05 % cream; Apply topically at bedtime.  Dispense: 45 g; Refill: 2  2. History of bariatric surgery  Doing well, continue supplementation   3. Iron deficiency anemia  - ferrous sulfate 300 (60 Fe) MG/5ML syrup; Take 5 mLs (300 mg total) by mouth daily.  Dispense: 150 mL; Refill: 3 - Hemoglobin and Hematocrit, Blood - Ferritin

## 2015-06-23 LAB — HEMOGLOBIN AND HEMATOCRIT, BLOOD
Hematocrit: 41.6 % (ref 34.0–46.6)
Hemoglobin: 13.7 g/dL (ref 11.1–15.9)

## 2015-06-23 LAB — FERRITIN: Ferritin: 16 ng/mL (ref 15–150)

## 2015-10-28 ENCOUNTER — Encounter: Payer: Managed Care, Other (non HMO) | Admitting: Family Medicine

## 2015-10-29 ENCOUNTER — Other Ambulatory Visit: Payer: Self-pay | Admitting: Family Medicine

## 2015-11-09 ENCOUNTER — Encounter: Payer: Managed Care, Other (non HMO) | Admitting: Family Medicine

## 2016-05-06 ENCOUNTER — Telehealth: Payer: Self-pay | Admitting: Family Medicine

## 2016-05-07 NOTE — Telephone Encounter (Signed)
Due to insurance pt is not able to see you. She have to have a [facility] physician

## 2016-06-06 ENCOUNTER — Encounter: Payer: Self-pay | Admitting: Family Medicine

## 2016-06-06 ENCOUNTER — Ambulatory Visit (INDEPENDENT_AMBULATORY_CARE_PROVIDER_SITE_OTHER): Payer: Managed Care, Other (non HMO) | Admitting: Family Medicine

## 2016-06-06 VITALS — BP 130/86 | HR 64 | Temp 98.2°F | Resp 18 | Ht 65.0 in | Wt 173.2 lb

## 2016-06-06 DIAGNOSIS — Z8639 Personal history of other endocrine, nutritional and metabolic disease: Secondary | ICD-10-CM | POA: Diagnosis not present

## 2016-06-06 DIAGNOSIS — N920 Excessive and frequent menstruation with regular cycle: Secondary | ICD-10-CM

## 2016-06-06 DIAGNOSIS — E663 Overweight: Secondary | ICD-10-CM | POA: Diagnosis not present

## 2016-06-06 DIAGNOSIS — E538 Deficiency of other specified B group vitamins: Secondary | ICD-10-CM

## 2016-06-06 DIAGNOSIS — R5383 Other fatigue: Secondary | ICD-10-CM | POA: Diagnosis not present

## 2016-06-06 DIAGNOSIS — Z9884 Bariatric surgery status: Secondary | ICD-10-CM | POA: Diagnosis not present

## 2016-06-06 DIAGNOSIS — Z13220 Encounter for screening for lipoid disorders: Secondary | ICD-10-CM | POA: Diagnosis not present

## 2016-06-06 DIAGNOSIS — Z23 Encounter for immunization: Secondary | ICD-10-CM | POA: Diagnosis not present

## 2016-06-06 DIAGNOSIS — Z1231 Encounter for screening mammogram for malignant neoplasm of breast: Secondary | ICD-10-CM

## 2016-06-06 DIAGNOSIS — E559 Vitamin D deficiency, unspecified: Secondary | ICD-10-CM

## 2016-06-06 DIAGNOSIS — Z1239 Encounter for other screening for malignant neoplasm of breast: Secondary | ICD-10-CM

## 2016-06-06 DIAGNOSIS — Z01419 Encounter for gynecological examination (general) (routine) without abnormal findings: Secondary | ICD-10-CM | POA: Diagnosis not present

## 2016-06-06 DIAGNOSIS — Z131 Encounter for screening for diabetes mellitus: Secondary | ICD-10-CM | POA: Diagnosis not present

## 2016-06-06 LAB — CBC WITH DIFFERENTIAL/PLATELET
BASOS PCT: 1 %
Basophils Absolute: 41 cells/uL (ref 0–200)
EOS PCT: 2 %
Eosinophils Absolute: 82 cells/uL (ref 15–500)
HEMATOCRIT: 35.2 % (ref 35.0–45.0)
HEMOGLOBIN: 10.9 g/dL — AB (ref 11.7–15.5)
Lymphocytes Relative: 56 %
Lymphs Abs: 2296 cells/uL (ref 850–3900)
MCH: 24.2 pg — ABNORMAL LOW (ref 27.0–33.0)
MCHC: 31 g/dL — ABNORMAL LOW (ref 32.0–36.0)
MCV: 78.2 fL — ABNORMAL LOW (ref 80.0–100.0)
MPV: 10.3 fL (ref 7.5–12.5)
Monocytes Absolute: 451 cells/uL (ref 200–950)
Monocytes Relative: 11 %
Neutro Abs: 1230 cells/uL — ABNORMAL LOW (ref 1500–7800)
Neutrophils Relative %: 30 %
Platelets: 231 10*3/uL (ref 140–400)
RBC: 4.5 MIL/uL (ref 3.80–5.10)
RDW: 16.9 % — ABNORMAL HIGH (ref 11.0–15.0)
WBC: 4.1 10*3/uL (ref 3.8–10.8)

## 2016-06-06 MED ORDER — NORGESTIM-ETH ESTRAD TRIPHASIC 0.18/0.215/0.25 MG-25 MCG PO TABS: 1.0000 | ORAL_TABLET | Freq: Every day | ORAL | 3 refills | Status: DC

## 2016-06-06 NOTE — Progress Notes (Signed)
Name: Jenny M LawrLYLIAN [id number]    DOB: [birth year]   Date:06/06/2016       Progress Note  Subjective  Chief Complaint  Chief Complaint  Patient presents with  . Annual Exam    HPI  Well Woman: she has not been sexually active for the over 2 years. She states are regular and doing well on ocp, otherwise cycles are very heavy. She denies bladder problems. Never had a mammogram. No recent labs and has been very tired all the time, she has also noticed thinning of her hair.    Patient Active Problem List   Diagnosis Date Noted  . Skin lesion on examination 10/27/2014  . Anemia, due to inadequate iron intake 10/24/2014  . B12 deficiency 10/24/2014  . 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 Overweight 10/24/2014  . Vitamin D deficiency 10/24/2014  . History of small bowel obstruction 08/22/2013  . History of bariatric surgery 02/16/2007    Past Surgical History:  Procedure Laterality Date  . CESAREAN SECTION  1990  . EXPLORATORY LAPAROTOMY  08/2013   at [facility], had a leak, a volvulos and hernia repair  . GASTRIC BYPASS  01/2007  . TONSILLECTOMY      Family History  Problem Relation Age of Onset  . COPD Mother   . Heart disease Mother     CHF  . CAD Mother     Social History   Social History  . Marital status: Single    Spouse name: N/A  . Number of children: N/A  . Years of education: N/A   Occupational History  . lab tech [employer]   Social History Main Topics  . Smoking status: Never Smoker  . Smokeless tobacco: Never Used  . Alcohol use No  . Drug use: No  . Sexual activity: Not Currently   Other Topics Concern  . Not on file   Social History Narrative   Currently single, works at [employer]   Her grown daughter is about to get married     Current Outpatient Prescriptions:  Marland Kitchen  Multiple Vitamin (MULTI-VITAMINS) TABS, Take 2 tablets by mouth daily. , Disp: , Rfl:  .  Norgestimate-Ethinyl Estradiol Triphasic (TRI-LO-MARZIA) 0.18/0.215/0.25 MG-25 MCG tab, Take 1  tablet by mouth daily., Disp: 84 tablet, Rfl: 3 .  calcium citrate-vitamin D (CALCIUM CITRATE +) 315-200 MG-UNIT per tablet, Take 1 tablet by mouth daily., Disp: , Rfl:  .  Cholecalciferol (VITAMIN D3) 2000 UNITS capsule, Take 1 capsule by mouth daily., Disp: , Rfl:  .  Cyanocobalamin (B-12) 1000 MCG SUBL, Place 1 tablet under the tongue daily., Disp: , Rfl:   No Known Allergies   ROS  Constitutional: Negative for fever or weight change.  Respiratory: Negative for cough and shortness of breath.   Cardiovascular: Negative for chest pain or palpitations.  Gastrointestinal: Negative for abdominal pain, no bowel changes.  Musculoskeletal: Negative for gait problem or joint swelling.  Skin: Negative for rash.  Neurological: Negative for dizziness or headache.  No other specific complaints in a complete review of systems (except as listed in HPI above).  Objective  Vitals:   06/06/16 1121  BP: 130/86  Pulse: 64  Resp: 18  Temp: 98.2 F (36.8 C)  TempSrc: Oral  SpO2: 98%  Weight: 173 lb 3.2 oz (78.6 kg)  Height:  (1.651 m)    Body mass index is 28.82 kg/m.  Physical Exam  Constitutional: Patient appears well-developed and overweight . No distress.  HENT: Head: Normocephalic and  atraumatic. Ears: B TMs ok, no erythema or effusion; Nose: Nose normal. Mouth/Throat: Oropharynx is clear and moist. No oropharyngeal exudate.  Eyes: Conjunctivae and EOM are normal. Pupils are equal, round, and reactive to light. No scleral icterus.  Neck: Normal range of motion. Neck supple. No JVD present. No thyromegaly present.  Cardiovascular: Normal rate, regular rhythm and normal heart sounds.  No murmur heard. No BLE edema. Pulmonary/Chest: Effort normal and breath sounds normal. No respiratory distress. Abdominal: Soft. Bowel sounds are normal, no distension. There is no tenderness. no masses Breast: no lumps or masses, no nipple discharge or rashes FEMALE GENITALIA:  External genitalia  normal External urethra normal Pelvic not done RECTAL: not done Musculoskeletal: Normal range of motion, no joint effusions. No gross deformities Neurological: he is alert and oriented to person, place, and time. No cranial nerve deficit. Coordination, balance, strength, speech and gait are normal.  Skin: Skin is warm and dry. No rash noted. No erythema.  Psychiatric: Patient has a normal mood and affect. behavior is normal. Judgment and thought content normal.   PHQ2/9: Depression screen [facility] 2/9 06/06/2016 06/22/2015 10/27/2014  Decreased Interest 0 0 0  Down, Depressed, Hopeless 0 0 0  PHQ - 2 Score 0 0 0    Fall Risk: Fall Risk  06/06/2016 06/22/2015 10/27/2014  Falls in the past year? No No No     Functional Status Survey: Is the patient deaf or have difficulty hearing?: No Does the patient have difficulty seeing, even when wearing glasses/contacts?: No Does the patient have difficulty concentrating, remembering, or making decisions?: No Does the patient have difficulty walking or climbing stairs?: No Does the patient have difficulty dressing or bathing?: No Does the patient have difficulty doing errands alone such as visiting a doctor's office or shopping?: No   Assessment & Plan  1. Well woman exam  Discussed importance of 150 minutes of physical activity weekly, eat two servings of fish weekly, eat one serving of tree nuts ( cashews, pistachios, pecans, almonds.Marland Kitchen) every other day, eat 6 servings of fruit/vegetables daily and drink plenty of water and avoid sweet beverages.   2. History of bariatric surgery  Recheck labs, she stopped supplements   3. Breast cancer screening  - MM Digital Screening; Future  4. Lipid screening  - Lipid panel  5. Need for Tdap vaccination  Patient left without getting immunization.   6. History of iron deficiency  -CBC  7. Vitamin D deficiency  - VITAMIN D 25 Hydroxy (Vit-D Deficiency, Fractures)  8. B12 deficiency  -  Vitamin B12  9. Fatigue, unspecified type  - Thyroid Panel With TSH  10. Menorrhagia with regular cycle  - Norgestimate-Ethinyl Estradiol Triphasic (TRI-LO-MARZIA) 0.18/0.215/0.25 MG-25 MCG tab; Take 1 tablet by mouth daily.  Dispense: 84 tablet; Refill: 3  11. Diabetes mellitus screening  - Hemoglobin A1c - Insulin, fasting  12. Overweight (BMI 25.0-29.9)  - Insulin, fasting

## 2016-06-07 LAB — LIPID PANEL
CHOLESTEROL: 142 mg/dL (ref <200)
HDL: 88 mg/dL (ref >50)
LDL Cholesterol: 43 mg/dL (ref <100)
Total CHOL/HDL Ratio: 1.6 Ratio (ref <5.0)
Triglycerides: 54 mg/dL (ref <150)
VLDL: 11 mg/dL (ref <30)

## 2016-06-07 LAB — THYROID PANEL WITH TSH
Free Thyroxine Index: 3.1 (ref 1.4–3.8)
T3 Uptake: 25 % (ref 22–35)
T4 TOTAL: 12.4 ug/dL — AB (ref 4.5–12.0)
TSH: 1.42 m[IU]/L

## 2016-06-07 LAB — HEMOGLOBIN A1C
HEMOGLOBIN A1C: 5.1 % (ref <5.7)
MEAN PLASMA GLUCOSE: 100 mg/dL

## 2016-06-07 LAB — INSULIN, FASTING: Insulin fasting, serum: 1.2 u[IU]/mL — ABNORMAL LOW (ref 2.0–19.6)

## 2016-06-07 LAB — VITAMIN D 25 HYDROXY (VIT D DEFICIENCY, FRACTURES): VIT D 25 HYDROXY: 32 ng/mL (ref 30–100)

## 2016-06-07 LAB — VITAMIN B12: VITAMIN B 12: 471 pg/mL (ref 200–1100)

## 2016-12-06 ENCOUNTER — Ambulatory Visit: Payer: Managed Care, Other (non HMO) | Admitting: Family Medicine

## 2017-07-13 ENCOUNTER — Other Ambulatory Visit: Payer: Self-pay | Admitting: Family Medicine

## 2017-07-13 DIAGNOSIS — N920 Excessive and frequent menstruation with regular cycle: Secondary | ICD-10-CM

## 2017-07-14 NOTE — Telephone Encounter (Signed)
Due for CPE, sent one refill, she has 3 months to be seen

## 2017-10-09 ENCOUNTER — Other Ambulatory Visit: Payer: Self-pay | Admitting: Family Medicine

## 2017-10-09 DIAGNOSIS — N920 Excessive and frequent menstruation with regular cycle: Secondary | ICD-10-CM

## 2017-10-09 NOTE — Telephone Encounter (Signed)
Needs to return for CPE , it can be 20 minute visit

## 2017-10-10 NOTE — Telephone Encounter (Signed)
Called [phone] and spoke with patient. I informed her that Dr Carlynn PurlSowles is requesting that she schedule an appt for refills. Pt did ask why did she have to come in for refills for her birthcontrol. I informed her that she has to get a physical once a year and that her last one was in April 2018. Patient then states that she will call back next week to make the appt because she is not able to come this week.

## 2020-08-18 ENCOUNTER — Inpatient Hospital Stay (HOSPITAL_COMMUNITY)
Admission: EM | Admit: 2020-08-18 | Discharge: 2020-08-23 | DRG: 357 | Disposition: A | Discharge status: Home / Self Care | Payer: Self-pay | Attending: General Surgery | Admitting: General Surgery

## 2020-08-18 ENCOUNTER — Emergency Department (HOSPITAL_COMMUNITY): Payer: No Typology Code available for payment source

## 2020-08-18 ENCOUNTER — Encounter (HOSPITAL_COMMUNITY): Payer: Self-pay | Admitting: Pharmacy Technician

## 2020-08-18 ENCOUNTER — Encounter (HOSPITAL_COMMUNITY): Admission: EM | Disposition: A | Discharge status: Home / Self Care | Payer: Self-pay | Source: Home / Self Care

## 2020-08-18 ENCOUNTER — Emergency Department (HOSPITAL_COMMUNITY): Payer: No Typology Code available for payment source | Admitting: Certified Registered Nurse Anesthetist

## 2020-08-18 DIAGNOSIS — K458 Other specified abdominal hernia without obstruction or gangrene: Secondary | ICD-10-CM | POA: Diagnosis present

## 2020-08-18 DIAGNOSIS — E559 Vitamin D deficiency, unspecified: Secondary | ICD-10-CM | POA: Diagnosis present

## 2020-08-18 DIAGNOSIS — Z6828 Body mass index (BMI) 28.0-28.9, adult: Secondary | ICD-10-CM

## 2020-08-18 DIAGNOSIS — K56609 Unspecified intestinal obstruction, unspecified as to partial versus complete obstruction: Secondary | ICD-10-CM

## 2020-08-18 DIAGNOSIS — E538 Deficiency of other specified B group vitamins: Secondary | ICD-10-CM | POA: Diagnosis present

## 2020-08-18 DIAGNOSIS — R188 Other ascites: Secondary | ICD-10-CM | POA: Diagnosis present

## 2020-08-18 DIAGNOSIS — Z9889 Other specified postprocedural states: Secondary | ICD-10-CM

## 2020-08-18 DIAGNOSIS — K558 Other vascular disorders of intestine: Secondary | ICD-10-CM | POA: Diagnosis present

## 2020-08-18 DIAGNOSIS — K469 Unspecified abdominal hernia without obstruction or gangrene: Principal | ICD-10-CM | POA: Diagnosis present

## 2020-08-18 DIAGNOSIS — Z9884 Bariatric surgery status: Secondary | ICD-10-CM

## 2020-08-18 DIAGNOSIS — E669 Obesity, unspecified: Secondary | ICD-10-CM | POA: Diagnosis present

## 2020-08-18 DIAGNOSIS — Z20822 Contact with and (suspected) exposure to covid-19: Secondary | ICD-10-CM | POA: Diagnosis present

## 2020-08-18 HISTORY — PX: VENTRAL HERNIA REPAIR: SHX424

## 2020-08-18 HISTORY — PX: LAPAROTOMY: SHX154

## 2020-08-18 LAB — URINALYSIS, ROUTINE W REFLEX MICROSCOPIC
Bacteria, UA: NONE SEEN
Bilirubin Urine: NEGATIVE
Glucose, UA: 150 mg/dL — AB
Hgb urine dipstick: NEGATIVE
Ketones, ur: 20 mg/dL — AB
Leukocytes,Ua: NEGATIVE
Nitrite: NEGATIVE
Protein, ur: 100 mg/dL — AB
Specific Gravity, Urine: 1.013 (ref 1.005–1.030)
pH: 7 (ref 5.0–8.0)

## 2020-08-18 LAB — LACTIC ACID, PLASMA: Lactic Acid, Venous: 1.3 mmol/L (ref 0.5–1.9)

## 2020-08-18 LAB — CBC WITH DIFFERENTIAL/PLATELET
Abs Immature Granulocytes: 0.02 10*3/uL (ref 0.00–0.07)
Basophils Absolute: 0 10*3/uL (ref 0.0–0.1)
Basophils Relative: 1 %
Eosinophils Absolute: 0 10*3/uL (ref 0.0–0.5)
Eosinophils Relative: 1 %
HCT: 34.5 % — ABNORMAL LOW (ref 36.0–46.0)
Hemoglobin: 10.2 g/dL — ABNORMAL LOW (ref 12.0–15.0)
Immature Granulocytes: 0 %
Lymphocytes Relative: 30 %
Lymphs Abs: 1.8 10*3/uL (ref 0.7–4.0)
MCH: 21.7 pg — ABNORMAL LOW (ref 26.0–34.0)
MCHC: 29.6 g/dL — ABNORMAL LOW (ref 30.0–36.0)
MCV: 73.2 fL — ABNORMAL LOW (ref 80.0–100.0)
Monocytes Absolute: 0.5 10*3/uL (ref 0.1–1.0)
Monocytes Relative: 8 %
Neutro Abs: 3.5 10*3/uL (ref 1.7–7.7)
Neutrophils Relative %: 60 %
Platelets: 297 10*3/uL (ref 150–400)
RBC: 4.71 MIL/uL (ref 3.87–5.11)
RDW: 18.8 % — ABNORMAL HIGH (ref 11.5–15.5)
WBC: 5.9 10*3/uL (ref 4.0–10.5)
nRBC: 0 % (ref 0.0–0.2)

## 2020-08-18 LAB — COMPREHENSIVE METABOLIC PANEL
ALT: 17 U/L (ref 0–44)
AST: 23 U/L (ref 15–41)
Albumin: 4.4 g/dL (ref 3.5–5.0)
Alkaline Phosphatase: 64 U/L (ref 38–126)
Anion gap: 9 (ref 5–15)
BUN: 15 mg/dL (ref 6–20)
CO2: 23 mmol/L (ref 22–32)
Calcium: 9.2 mg/dL (ref 8.9–10.3)
Chloride: 105 mmol/L (ref 98–111)
Creatinine, Ser: 0.75 mg/dL (ref 0.44–1.00)
GFR, Estimated: >60 mL/min (ref >60)
Glucose, Bld: 163 mg/dL — ABNORMAL HIGH (ref 70–99)
Potassium: 3.7 mmol/L (ref 3.5–5.1)
Sodium: 137 mmol/L (ref 135–145)
Total Bilirubin: 0.3 mg/dL (ref 0.3–1.2)
Total Protein: 7.3 g/dL (ref 6.5–8.1)

## 2020-08-18 LAB — I-STAT BETA HCG BLOOD, ED (MC, WL, AP ONLY): I-stat hCG, quantitative: <5 m[IU]/mL (ref <5)

## 2020-08-18 LAB — TROPONIN I (HIGH SENSITIVITY)
Troponin I (High Sensitivity): <2 ng/L (ref <18)
Troponin I (High Sensitivity): <2 ng/L (ref <18)

## 2020-08-18 LAB — LIPASE, BLOOD: Lipase: 36 U/L (ref 11–51)

## 2020-08-18 SURGERY — LAPAROTOMY, EXPLORATORY
Anesthesia: General | Site: Abdomen

## 2020-08-18 MED ORDER — IOHEXOL 300 MG/ML  SOLN
100.0000 mL | Freq: Once | INTRAMUSCULAR | Status: AC | PRN
  Administered 2020-08-18: 100 mL via INTRAVENOUS

## 2020-08-18 MED ORDER — FENTANYL CITRATE (PF) 250 MCG/5ML IJ SOLN
INTRAMUSCULAR | Status: AC
  Filled 2020-08-18: qty 5

## 2020-08-18 MED ORDER — MIDAZOLAM HCL 2 MG/2ML IJ SOLN
INTRAMUSCULAR | Status: AC
  Filled 2020-08-18: qty 2

## 2020-08-18 MED ORDER — HYDROMORPHONE HCL 1 MG/ML IJ SOLN
1.0000 mg | Freq: Once | INTRAMUSCULAR | Status: AC
  Administered 2020-08-18: 1 mg via INTRAVENOUS
  Filled 2020-08-18: qty 1

## 2020-08-18 SURGICAL SUPPLY — 44 items
BLADE CLIPPER SURG (BLADE) IMPLANT
CANISTER SUCT 3000ML PPV (MISCELLANEOUS) ×3 IMPLANT
CHLORAPREP W/TINT 26 (MISCELLANEOUS) ×3 IMPLANT
COVER SURGICAL LIGHT HANDLE (MISCELLANEOUS) ×3 IMPLANT
DRAPE LAPAROSCOPIC ABDOMINAL (DRAPES) ×3 IMPLANT
DRAPE WARM FLUID 44X44 (DRAPES) ×3 IMPLANT
DRSG OPSITE POSTOP 4X10 (GAUZE/BANDAGES/DRESSINGS) ×3 IMPLANT
DRSG OPSITE POSTOP 4X12 (GAUZE/BANDAGES/DRESSINGS) ×3 IMPLANT
DRSG OPSITE POSTOP 4X8 (GAUZE/BANDAGES/DRESSINGS) IMPLANT
ELECT BLADE 6.5 EXT (BLADE) IMPLANT
ELECT CAUTERY BLADE 6.4 (BLADE) ×3 IMPLANT
ELECT REM PT RETURN 9FT ADLT (ELECTROSURGICAL) ×3
ELECTRODE REM PT RTRN 9FT ADLT (ELECTROSURGICAL) ×2 IMPLANT
GLOVE SRG 8 PF TXTR STRL LF DI (GLOVE) ×2 IMPLANT
GLOVE SURG ENC MOIS LTX SZ7.5 (GLOVE) ×3 IMPLANT
GLOVE SURG UNDER POLY LF SZ8 (GLOVE) ×1
GOWN STRL REUS W/ TWL LRG LVL3 (GOWN DISPOSABLE) ×2 IMPLANT
GOWN STRL REUS W/ TWL XL LVL3 (GOWN DISPOSABLE) ×2 IMPLANT
GOWN STRL REUS W/TWL LRG LVL3 (GOWN DISPOSABLE) ×1
GOWN STRL REUS W/TWL XL LVL3 (GOWN DISPOSABLE) ×1
HANDLE SUCTION POOLE (INSTRUMENTS) ×2 IMPLANT
KIT BASIN OR (CUSTOM PROCEDURE TRAY) ×3 IMPLANT
KIT TURNOVER KIT B (KITS) ×3 IMPLANT
LIGASURE IMPACT 36 18CM CVD LR (INSTRUMENTS) IMPLANT
MESH VICRYL KNITTED 12X12 (Mesh General) ×3 IMPLANT
NS IRRIG 1000ML POUR BTL (IV SOLUTION) ×6 IMPLANT
PACK GENERAL/GYN (CUSTOM PROCEDURE TRAY) ×3 IMPLANT
PAD ARMBOARD 7.5X6 YLW CONV (MISCELLANEOUS) ×3 IMPLANT
PENCIL SMOKE EVACUATOR (MISCELLANEOUS) ×3 IMPLANT
RELOAD PROXIMATE 75MM BLUE (ENDOMECHANICALS) ×6 IMPLANT
SPECIMEN JAR LARGE (MISCELLANEOUS) IMPLANT
SPONGE LAP 18X18 RF (DISPOSABLE) IMPLANT
STAPLER PROXIMATE 75MM BLUE (STAPLE) ×3 IMPLANT
STAPLER VISISTAT 35W (STAPLE) ×3 IMPLANT
SUCTION POOLE HANDLE (INSTRUMENTS) ×3
SUT PDS AB 1 TP1 54 (SUTURE) ×6 IMPLANT
SUT SILK 2 0 SH CR/8 (SUTURE) ×3 IMPLANT
SUT SILK 2 0 TIES 10X30 (SUTURE) ×3 IMPLANT
SUT SILK 3 0 SH CR/8 (SUTURE) ×3 IMPLANT
SUT SILK 3 0 TIES 10X30 (SUTURE) ×3 IMPLANT
SUT VIC AB 2-0 SH 18 (SUTURE) ×3 IMPLANT
TOWEL GREEN STERILE (TOWEL DISPOSABLE) ×3 IMPLANT
TRAY FOLEY MTR SLVR 16FR STAT (SET/KITS/TRAYS/PACK) ×3 IMPLANT
YANKAUER SUCT BULB TIP NO VENT (SUCTIONS) IMPLANT

## 2020-08-18 NOTE — ED Provider Notes (Signed)
Emergency Medicine Provider Triage Evaluation Note  Jenny Scott , a 45 y.o. female  was evaluated in triage.  Pt complains of sudden onset of severe abdominal pain with nausea and diarrhea.  She has a hernia that has not been repaired.  Her pain is around the hernia however remotely itself is not painful or tender..  Review of Systems  Positive: Abdominal pain, nausea, diarrhea Negative: Fevers  Physical Exam  BP (!) 172/106 (BP Location: Right Arm)   Pulse 74   Temp 97.8 F (36.6 C) (Oral)   Resp 20   SpO2 100%  Gen:   Awake, appears uncomfortable Resp:  Normal effort  MSK:   Moves extremities without difficulty  Other:  Hernia appears to be able to be reduced.  She appears to be in pain.  She has goosebumps.  She is unable to stand still due to pain.  Medical Decision Making  Medically screening exam initiated at 7:09 PM.  Appropriate orders placed.  MANNIE WINELAND was informed that the remainder of the evaluation will be completed by another provider, this initial triage assessment does not replace that evaluation, and the importance of remaining in the ED until their evaluation is complete.  Note: Portions of this report may have been transcribed using voice recognition software. Every effort was made to ensure accuracy; however, inadvertent computerized transcription errors may be present    Norman Clay 08/18/20 1911    Terald Sleeper, MD 08/19/20 [phone]

## 2020-08-18 NOTE — ED Provider Notes (Signed)
[facility] EMERGENCY DEPARTMENT Provider Note   CSN: [id number] Arrival date & time: 08/18/20  1833     History Chief Complaint  Patient presents with   Abdominal Pain    Jenny Scott is a 45 y.o. female with a history of anemia, obesity, bariatric surgery.  Patient presents emerged department with a chief complaint of abdominal pain.  Patient had sudden onset of abdominal pain at approximately 1630 this afternoon while driving home.  Pain is located to her epigastric area however radiates throughout her abdomen.  Patient rates pain 10/10 on the pain scale.  Pain has been constant.  Pain is worse with movement.  Pain has become progressively worse throughout the afternoon.  Patient denies any alleviating factors.  Patient endorses nausea, chills and diarrhea.  Patient states that she has been dry heaving but has not produced any emesis.  Patient denies any fevers, nausea, blood in stool, melena, dysuria, hematuria, vaginal bleeding, vaginal pain, vaginal discharge, syncope, chest pain, shortness of breath, neck pain, back pain.  Patient denies any traumatic injuries.  Patient states that she had revision gastric bypass surgery performed January 2022 in [place] by a Dr. Merrilee Jansky.  Patient had regional gastric bypass surgery performed at [facility] in 2008.   Abdominal Pain Associated symptoms: chills, diarrhea and nausea   Associated symptoms: no chest pain, no constipation, no dysuria, no fever, no hematuria, no shortness of breath, no vaginal bleeding, no vaginal discharge and no vomiting       Past Medical History:  Diagnosis Date   Anemia    Contraception    Dysmenorrhea    Hirsutism    Menorrhagia    Obesity    Vitamin B 12 deficiency    Vitamin D deficiency     Patient Active Problem List   Diagnosis Date Noted   Skin lesion on examination 10/27/2014   Anemia, due to inadequate iron intake 10/24/2014   B12 deficiency 10/24/2014   Hypertrichosis  10/24/2014   Overweight 10/24/2014   Vitamin D deficiency 10/24/2014   History of small bowel obstruction 08/22/2013   History of bariatric surgery 02/16/2007    Past Surgical History:  Procedure Laterality Date   CESAREAN SECTION  1990   EXPLORATORY LAPAROTOMY  08/2013   at [facility], had a leak, a volvulos and hernia repair   GASTRIC BYPASS  01/2007   TONSILLECTOMY       OB History     Gravida  1   Para  1   Term  1   Preterm  0   AB  0   Living  1      SAB      IAB      Ectopic      Multiple      Live Births              Family History  Problem Relation Age of Onset   COPD Mother    Heart disease Mother        CHF   CAD Mother     Social History   Tobacco Use   Smoking status: Never   Smokeless tobacco: Never  Substance Use Topics   Alcohol use: No    Alcohol/week: 0.0 standard drinks   Drug use: No    Home Medications Prior to Admission medications   Medication Sig Start Date End Date Taking? Authorizing Provider  calcium citrate-vitamin D (CALCIUM CITRATE +) 315-200 MG-UNIT per tablet Take 1 tablet by  mouth daily.    [provider]  Cholecalciferol (VITAMIN D3) 2000 UNITS capsule Take 1 capsule by mouth daily.    [provider]  Cyanocobalamin (B-12) 1000 MCG SUBL Place 1 tablet under the tongue daily. 01/30/13   [provider]  Multiple Vitamin (MULTI-VITAMINS) TABS Take 2 tablets by mouth daily.     [provider]  TRI-LO-MARZIA 0.18/0.215/0.25 MG-25 MCG tab TAKE 1 TABLET BY MOUTH DAILY. 07/14/17   Alba Cory, MD    Allergies    Patient has no known allergies.  Review of Systems   Review of Systems  Constitutional:  Positive for chills. Negative for fever.  Eyes:  Negative for visual disturbance.  Respiratory:  Negative for shortness of breath.   Cardiovascular:  Negative for chest pain.  Gastrointestinal:  Positive for abdominal pain, diarrhea and nausea. Negative for abdominal  distention, blood in stool, constipation and vomiting.  Genitourinary:  Negative for difficulty urinating, dysuria, frequency, hematuria, vaginal bleeding, vaginal discharge and vaginal pain.  Musculoskeletal:  Negative for back pain and neck pain.  Skin:  Negative for color change and rash.  Neurological:  Negative for dizziness, syncope, light-headedness and headaches.  Psychiatric/Behavioral:  Negative for confusion.    Physical Exam Updated Vital Signs BP (!) 172/106 (BP Location: Right Arm)   Pulse 74   Temp 97.8 F (36.6 C) (Oral)   Resp 20   SpO2 100%   Physical Exam Vitals and nursing note reviewed.  Constitutional:      General: She is not in acute distress.    Appearance: She is not ill-appearing, toxic-appearing or diaphoretic.     Comments: Uncomfortable due to complaints of pain  HENT:     Head: Normocephalic.  Eyes:     General: No scleral icterus.       Right eye: No discharge.        Left eye: No discharge.  Cardiovascular:     Rate and Rhythm: Normal rate.     Pulses:          Radial pulses are 2+ on the right side and 2+ on the left side.     Heart sounds: Normal heart sounds.  Pulmonary:     Effort: Pulmonary effort is normal. No tachypnea, bradypnea or respiratory distress.     Breath sounds: Normal breath sounds. No stridor.  Abdominal:     General: Bowel sounds are normal. There is no distension. There are no signs of injury.     Palpations: Abdomen is soft. There is no mass or pulsatile mass.     Tenderness: There is generalized abdominal tenderness. There is no right CVA tenderness, left CVA tenderness, guarding or rebound.     Hernia: There is no hernia in the umbilical area or ventral area.     Comments: Abdomen soft, nondistended, diffuse tenderness throughout    Musculoskeletal:     Cervical back: Neck supple.  Skin:    General: Skin is warm and dry.     Coloration: Skin is not cyanotic or jaundiced.  Neurological:     General: No focal  deficit present.     Mental Status: She is alert.  Psychiatric:        Behavior: Behavior is cooperative.    ED Results / Procedures / Treatments   Labs (all labs ordered are listed, but only abnormal results are displayed) Labs Reviewed  COMPREHENSIVE METABOLIC PANEL - Abnormal; Notable for the following components:      Result Value  Glucose, Bld 163 (*)    All other components within normal limits  CBC WITH DIFFERENTIAL/PLATELET - Abnormal; Notable for the following components:   Hemoglobin 10.2 (*)    HCT 34.5 (*)    MCV 73.2 (*)    MCH 21.7 (*)    MCHC 29.6 (*)    RDW 18.8 (*)    All other components within normal limits  URINALYSIS, ROUTINE W REFLEX MICROSCOPIC - Abnormal; Notable for the following components:   Color, Urine STRAW (*)    Glucose, UA 150 (*)    Ketones, ur 20 (*)    Protein, ur 100 (*)    All other components within normal limits  RESP PANEL BY RT-PCR (FLU A&B, COVID) ARPGX2  LIPASE, BLOOD  LACTIC ACID, PLASMA  I-STAT BETA HCG BLOOD, ED ([facility], [facility], [facility] ONLY)  TROPONIN I (HIGH SENSITIVITY)  TROPONIN I (HIGH SENSITIVITY)    EKG None  Radiology CT Abdomen Pelvis W Contrast  Result Date: 08/18/2020 CLINICAL DATA:  Nonlocalized acute abdominal pain. Contrast Patient here with reports of sudden onset abdominal pain along with nausea and diarrhea. Patient states hx of hernia that was not repaired. EXAM: CT ABDOMEN AND PELVIS WITH CONTRAST TECHNIQUE: Multidetector CT imaging of the abdomen and pelvis was performed using the standard protocol following bolus administration of intravenous contrast. CONTRAST:  OMNIPAQUE IOHEXOL 300 MG/ML  SOLN COMPARISON:  None. FINDINGS: Lower chest: No acute abnormality. Hepatobiliary: No focal liver abnormality. No gallstones, gallbladder wall thickening, or pericholecystic fluid. No biliary dilatation. Pancreas: No focal lesion. Normal pancreatic contour. No surrounding inflammatory changes. No main pancreatic ductal  dilatation. Spleen: Normal in size without focal abnormality. Adrenals/Urinary Tract: No adrenal nodule bilaterally. Bilateral kidneys enhance symmetrically. Subcentimeter hypodensities are too small to characterize. No hydronephrosis. No hydroureter. The urinary bladder is unremarkable. 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 Stomach is within normal limits. Long segment of small bowel dilated with fecalized material with a proximal and distal transition point noted consistent with a closed loop obstruction (33, 6:40). Associated mesenteric swirling within the left upper abdomen (3:40-49). Several nonenhancing loops of collapsed small bowel in the setting of marked mesenteric edema. Question pneumatosis (3:63). The large bowel is completely collapsed. The appendix not definitely identified. Vascular/Lymphatic: No portal venous or mesenteric venous gas identified. No abdominal aorta or iliac aneurysm. Question prominent mesenteric lymph nodes. No abdominal, pelvic, or inguinal lymphadenopathy. Reproductive: Uterus and bilateral adnexa are unremarkable. Other: Small volume free fluid ascites. No intraperitoneal free gas. No organized fluid collection. Musculoskeletal: No abdominal wall hernia or abnormality. No suspicious lytic or blastic osseous lesions. No acute displaced fracture. L5-S1 degenerative changes. IMPRESSION: Long segment closed loop small bowel obstruction with associated small bowel ischemia. At least small volume ascites and associated marked mesenteric edema. Recommend emergent surgical consult. These results were called by telephone at the time of interpretation on 08/18/2020 at 10:54 pm to provider PA [facility] , who verbally acknowledged these results. Electronically Signed   By: Tish Frederickson M.D.   On: 08/18/2020 23:02    Procedures .Critical Care  Date/Time: 08/19/2020 2:01 AM Performed by: Haskel Schroeder, PA-C Authorized by: Haskel Schroeder, PA-C   Critical care provider statement:    Critical care time (minutes):  45   Critical care was necessary to treat or prevent imminent or life-threatening deterioration of the following conditions: Bowel ischemia.   Critical care was time spent personally by me on the following activities:  Discussions with consultants,  evaluation of patient's response to treatment, examination of patient, ordering and performing treatments and interventions, ordering and review of laboratory studies, ordering and review of radiographic studies, pulse oximetry, re-evaluation of patient's condition, obtaining history from patient or surrogate and review of old charts   Care discussed with: admitting provider   Comments:     Critical care performed due to closed-loop small bowel obstruction with associated small bowel ischemia   Medications Ordered in ED Medications  HYDROmorphone (DILAUDID) injection 1 mg (1 mg Intravenous Given 08/18/20 2219)  iohexol (OMNIPAQUE) 300 MG/ML solution 100 mL (100 mLs Intravenous Contrast Given 08/18/20 2242)    ED Course  I have reviewed the triage vital signs and the nursing notes.  Pertinent labs & imaging results that were available during my care of the patient were reviewed by me and considered in my medical decision making (see chart for details).  Clinical Course as of 08/19/20 0155  Thu Aug 18, 2020  2254 Spoke to radiologist  [PB]    Clinical Course User Index [PB] Berneice HeinrichBadalamente, Aryan Sparks R, PA-C   MDM Rules/Calculators/A&P                          Alert 45 year old female appears uncomfortable due to complaints of abdominal pain.  Patient is nontoxic and in no acute distress.  Patient complains of sudden onset of epigastric abdominal pain that radiates throughout her abdomen.  Pain began at 1630 this afternoon.  Patient endorses associated nausea, dry heaving, and diarrhea.  On physical exam abdomen soft, nondistended, diffuse tenderness throughout abdomen.  No  guarding or rebound tenderness.  No umbilical or ventral hernia appreciated.  Patient is hemodynamically stable.  CMP is unremarkable. CBC shows anemia with hemoglobin of 10.2 and hematocrit at 34.5; appears to be at patient's baseline. UA shows no signs of infection. Lipase within normal limits Lactic acid 1.3.  Troponin less than 2. Pregnancy test negative.  Due to patient's nonlocalized abdominal tenderness will obtain CT abdomen pelvis to evaluate for cause of her symptoms.  2254 contacted by radiologist with report of long segment closed-loop small bowel obstruction with associated small bowel ischemia.  Due to findings on CT scan we will consult general surgery.  2307 spoke to Dr. Derrell Lollingamirez with general surgery who will come to see the patient.  Per Dr. Cynda Familiaomero's note patient will be brought to the OR.  Final Clinical Impression(s) / ED Diagnoses Final diagnoses:  None    Rx / DC Orders ED Discharge Orders     None        Berneice HeinrichBadalamente, Ivett Luebbe R, PA-C 08/19/20 [id number]    Vanetta MuldersZackowski, Scott, MD 08/25/20 1355

## 2020-08-18 NOTE — ED Triage Notes (Signed)
Pt here with reports of sudden onset abdominal pain along with nausea and diarrhea. Pt states hx hernia that was not repaired.

## 2020-08-18 NOTE — Anesthesia Preprocedure Evaluation (Addendum)
Anesthesia Evaluation  Patient identified by MRN, date of birth, ID band Patient awake    Reviewed: Allergy & Precautions, NPO status , Patient's Chart, lab work & pertinent test results  History of Anesthesia Complications Negative for: history of anesthetic complications  Airway Mallampati: I  TM Distance: >3 FB Neck ROM: Full    Dental  (+) Dental Advisory Given   Pulmonary neg pulmonary ROS,    breath sounds clear to auscultation       Cardiovascular negative cardio ROS   Rhythm:Regular Rate:Tachycardia     Neuro/Psych negative neurological ROS     GI/Hepatic Neg liver ROS, S/p gastric bypass 2008, now with recurrent SBO   Endo/Other  obese  Renal/GU negative Renal ROS     Musculoskeletal   Abdominal (+) + obese,   Peds  Hematology  (+) Blood dyscrasia (Hb 10.2), anemia ,   Anesthesia Other Findings   Reproductive/Obstetrics                            Anesthesia Physical Anesthesia Plan  ASA: 3 and emergent  Anesthesia Plan: General   Post-op Pain Management:    Induction: Intravenous  PONV Risk Score and Plan: 3 and Ondansetron, Dexamethasone and Treatment may vary due to age or medical condition  Airway Management Planned: Oral ETT  Additional Equipment: None  Intra-op Plan:   Post-operative Plan: Possible Post-op intubation/ventilation  Informed Consent: I have reviewed the patients History and Physical, chart, labs and discussed the procedure including the risks, benefits and alternatives for the proposed anesthesia with the patient or authorized representative who has indicated his/her understanding and acceptance.     Dental advisory given  Plan Discussed with: CRNA and Surgeon  Anesthesia Plan Comments:        Anesthesia Quick Evaluation

## 2020-08-18 NOTE — H&P (Signed)
Jenny Scott is an 45 y.o. female.   Chief Complaint: Abdominal pain HPI: Patient is a 45 year old female, with a history of gastric bypass in 2008, and gastric bypass revision in January 2022.  Patient's daughter states that she began having abdominal pain at 4:30 PM.  She states that she had 3 previous episodes of similar pain that have gotten better 3 times since her last operation.  She states that the pain continued and was fairly severe.  She states this was associated with nausea and diarrhea.  Patient denies any fevers or chills at home.  Of note patient has had previous exploration secondary to likely internal hernia after her first bypass surgery.  Patient daughter states that her 2 bypass [place].   Upon evaluation the ER patient underwent CT scan.  This was significant for internal hernia, possible ischemic bowel.  I did review the CT scan personally. Patient had no leukocytosis and had a normal lactate level.  General surgery was consulted for further evaluation and management.  Past Medical History:  Diagnosis Date   Anemia    Contraception    Dysmenorrhea    Hirsutism    Menorrhagia    Obesity    Vitamin B 12 deficiency    Vitamin D deficiency     Past Surgical History:  Procedure Laterality Date   CESAREAN SECTION  1990   EXPLORATORY LAPAROTOMY  08/2013   at [facility], had a leak, a volvulos and hernia repair   GASTRIC BYPASS  01/2007   TONSILLECTOMY      Family History  Problem Relation Age of Onset   COPD Mother    Heart disease Mother        CHF   CAD Mother    Social History:  reports that she has never smoked. She has never used smokeless tobacco. She reports that she does not drink alcohol and does not use drugs.  Allergies: No Known Allergies  (Not in a hospital admission)   Results for orders placed or performed during the hospital encounter of 08/18/20 (from the past 48 hour(s))  Comprehensive metabolic panel      Status: Abnormal   Collection Time: 08/18/20  7:41 PM  Result Value Ref Range   Sodium 137 135 - 145 mmol/L   Potassium 3.7 3.5 - 5.1 mmol/L   Chloride 105 98 - 111 mmol/L   CO2 23 22 - 32 mmol/L   Glucose, Bld 163 (H) 70 - 99 mg/dL    Comment: Glucose reference range applies only to samples taken after fasting for at least 8 hours.   BUN 15 6 - 20 mg/dL   Creatinine, Ser 1.610.75 0.44 - 1.00 mg/dL   Calcium 9.2 8.9 - 09.610.3 mg/dL   Total Protein 7.3 6.5 - 8.1 g/dL   Albumin 4.4 3.5 - 5.0 g/dL   AST 23 15 - 41 U/L   ALT 17 0 - 44 U/L   Alkaline Phosphatase 64 38 - 126 U/L   Total Bilirubin 0.3 0.3 - 1.2 mg/dL   GFR, Estimated >04>60 >54>60 mL/min    Comment: (NOTE) Calculated using the CKD-EPI Creatinine Equation (2021)    Anion gap 9 5 - 15    Comment: Performed at [facility], [address] [place], [place] [id number]  Lipase, blood     Status: None   Collection Time: 08/18/20  7:41 PM  Result Value Ref Range   Lipase 36 11 - 51 U/L  Comment: Performed at [facility], [address], [place], [place] [address]  CBC with Differential     Status: Abnormal   Collection Time: 08/18/20  7:41 PM  Result Value Ref Range   WBC 5.9 4.0 - 10.5 K/uL   RBC 4.71 3.87 - 5.11 MIL/uL   Hemoglobin 10.2 (L) 12.0 - 15.0 g/dL   HCT 32.9 (L) 51.8 - 84.1 %   MCV 73.2 (L) 80.0 - 100.0 fL   MCH 21.7 (L) 26.0 - 34.0 pg   MCHC 29.6 (L) 30.0 - 36.0 g/dL   RDW 66.0 (H) 63.0 - 16.0 %   Platelets 297 150 - 400 K/uL   nRBC 0.0 0.0 - 0.2 %   Neutrophils Relative % 60 %   Neutro Abs 3.5 1.7 - 7.7 K/uL   Lymphocytes Relative 30 %   Lymphs Abs 1.8 0.7 - 4.0 K/uL   Monocytes Relative 8 %   Monocytes Absolute 0.5 0.1 - 1.0 K/uL   Eosinophils Relative 1 %   Eosinophils Absolute 0.0 0.0 - 0.5 K/uL   Basophils Relative 1 %   Basophils Absolute 0.0 0.0 - 0.1 K/uL   Immature Granulocytes 0 %   Abs Immature Granulocytes 0.02 0.00 - 0.07 K/uL    Comment: Performed at [facility], [address] [place], [place] [address]  Troponin I (High Sensitivity)     Status: None   Collection Time: 08/18/20  7:41 PM  Result Value Ref Range   Troponin I (High Sensitivity) <2 <18 ng/L    Comment: (NOTE) Elevated high sensitivity troponin I (hsTnI) values and significant  changes across serial measurements may suggest ACS but many other  chronic and acute conditions are known to elevate hsTnI results.  Refer to the "Links" section for chest pain algorithms and additional  guidance. Performed at [facility], [address], [place], [place] [address]   Lactic acid, plasma     Status: None   Collection Time: 08/18/20  7:45 PM  Result Value Ref Range   Lactic Acid, Venous 1.3 0.5 - 1.9 mmol/L    Comment: Performed at [facility], [address] [place], [place] [address]  Urinalysis, Routine w reflex microscopic Urine, Clean Catch     Status: Abnormal   Collection Time: 08/18/20  8:27 PM  Result Value Ref Range   Color, Urine STRAW (A) YELLOW   APPearance CLEAR CLEAR   Specific Gravity, Urine 1.013 1.005 - 1.030   pH 7.0 5.0 - 8.0   Glucose, UA 150 (A) NEGATIVE mg/dL   Hgb urine dipstick NEGATIVE NEGATIVE   Bilirubin Urine NEGATIVE NEGATIVE   Ketones, ur 20 (A) NEGATIVE mg/dL   Protein, ur 427 (A) NEGATIVE mg/dL   Nitrite NEGATIVE NEGATIVE   Leukocytes,Ua NEGATIVE NEGATIVE   RBC / HPF 6-10 0 - 5 RBC/hpf   WBC, UA 0-5 0 - 5 WBC/hpf   Bacteria, UA NONE SEEN NONE SEEN   Squamous Epithelial / LPF 0-5 0 - 5   Mucus PRESENT     Comment: Performed at [facility], [address] [place], [place] [address]  Troponin I (High Sensitivity)     Status: None   Collection Time: 08/18/20  9:28 PM  Result Value Ref Range   Troponin I (High Sensitivity) <2 <18 ng/L    Comment: (NOTE) Elevated high sensitivity troponin I (hsTnI) values and significant  changes across serial measurements may suggest ACS but many other  chronic and acute conditions are known to elevate  hsTnI results.  Refer to the "Links" section for chest pain algorithms and additional  guidance. Performed at [facility], [address] [place], [place] [address]   I-Stat Beta hCG blood, ED ([facility], [facility], [facility] only)     Status: None   Collection Time: 08/18/20  9:47 PM  Result Value Ref Range   I-stat hCG, quantitative <5.0 <5 mIU/mL   Comment 3            Comment:   GEST. AGE      CONC.  (mIU/mL)   <=1 WEEK        5 - 50     2 WEEKS       50 - 500     3 WEEKS       100 - 10,000     4 WEEKS     1,000 - 30,000        FEMALE AND NON-PREGNANT FEMALE:     LESS THAN 5 mIU/mL    CT Abdomen Pelvis W Contrast  Result Date: 08/18/2020 CLINICAL DATA:  Nonlocalized acute abdominal pain. Contrast Patient here with reports of sudden onset abdominal pain along with nausea and diarrhea. Patient states hx of hernia that was not repaired. EXAM: CT ABDOMEN AND PELVIS WITH CONTRAST TECHNIQUE: Multidetector CT imaging of the abdomen and pelvis was performed using the standard protocol following bolus administration of intravenous contrast. CONTRAST:  OMNIPAQUE IOHEXOL 300 MG/ML  SOLN COMPARISON:  None. FINDINGS: Lower chest: No acute abnormality. Hepatobiliary: No focal liver abnormality. No gallstones, gallbladder wall thickening, or pericholecystic fluid. No biliary dilatation. Pancreas: No focal lesion. Normal pancreatic contour. No surrounding inflammatory changes. No main pancreatic ductal dilatation. Spleen: Normal in size without focal abnormality. Adrenals/Urinary Tract: No adrenal nodule bilaterally. Bilateral kidneys enhance symmetrically. Subcentimeter hypodensities are too small to characterize. No hydronephrosis. No hydroureter. The urinary bladder is unremarkable. 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 Stomach is within normal limits. Long segment of small bowel dilated with fecalized material with a proximal and distal transition point noted consistent with a  closed loop obstruction (33, 6:40). Associated mesenteric swirling within the left upper abdomen (3:40-49). Several nonenhancing loops of collapsed small bowel in the setting of marked mesenteric edema. Question pneumatosis (3:63). The large bowel is completely collapsed. The appendix not definitely identified. Vascular/Lymphatic: No portal venous or mesenteric venous gas identified. No abdominal aorta or iliac aneurysm. Question prominent mesenteric lymph nodes. No abdominal, pelvic, or inguinal lymphadenopathy. Reproductive: Uterus and bilateral adnexa are unremarkable. Other: Small volume free fluid ascites. No intraperitoneal free gas. No organized fluid collection. Musculoskeletal: No abdominal wall hernia or abnormality. No suspicious lytic or blastic osseous lesions. No acute displaced fracture. L5-S1 degenerative changes. IMPRESSION: Long segment closed loop small bowel obstruction with associated small bowel ischemia. At least small volume ascites and associated marked mesenteric edema. Recommend emergent surgical consult. These results were called by telephone at the time of interpretation on 08/18/2020 at 10:54 pm to provider PA [facility] , who verbally acknowledged these results. Electronically Signed   By: Tish Frederickson M.D.   On: 08/18/2020 23:02    Review of Systems  Constitutional:  Negative for chills and fever.  HENT:  Negative for ear discharge, hearing loss and sore throat.   Eyes:  Negative for discharge.  Respiratory:  Negative for cough and shortness of breath.   Cardiovascular:  Negative for chest pain and leg swelling.  Gastrointestinal:  Positive for abdominal distention, abdominal pain, diarrhea and nausea. Negative for constipation and vomiting.  Musculoskeletal:  Negative for myalgias and neck pain.  Skin:  Negative for rash.  Allergic/Immunologic: Negative for environmental allergies.  Neurological:  Negative for dizziness and seizures.  Hematological:  Does not  bruise/bleed easily.  Psychiatric/Behavioral:  Negative for suicidal ideas.   All other systems reviewed and are negative.  Blood pressure (!) 178/97, pulse 81, temperature 97.8 F (36.6 C), temperature source Oral, resp. rate (!) 26, SpO2 100 %. Physical Exam Constitutional:      Appearance: She is well-developed.     Comments: Conversant No acute distress  Eyes:     General: Lids are normal. No scleral icterus.    Comments: Pupils are equal round and reactive No lid lag Moist conjunctiva  Neck:     Thyroid: No thyromegaly.     Trachea: No tracheal tenderness.     Comments: No cervical lymphadenopathy Cardiovascular:     Rate and Rhythm: Normal rate and regular rhythm.     Heart sounds: No murmur heard. Pulmonary:     Effort: Pulmonary effort is normal.     Breath sounds: Normal breath sounds. No wheezing or rales.  Abdominal:     Tenderness: There is generalized abdominal tenderness. There is no guarding or rebound.     Hernia: No hernia is present.  Skin:    General: Skin is warm.     Findings: No rash.     Nails: There is no clubbing.     Comments: Normal skin turgor  Neurological:     Mental Status: She is alert and oriented to person, place, and time.     Comments: Normal gait and station  Psychiatric:        Judgment: Judgment normal.     Comments: Appropriate affect     Assessment/Plan 45 year old female status post gastric bypass surgery, possible ischemic bowel, internal hernia  1.  We will proceed to the operating room emergently for exploratory laparotomy, possible bowel resection. 2.  Discussed with the patient and her daughter the risk and benefits of the procedure to include but not limited to: Infection, bleeding, damage to surrounding structures, possible need for further surgery, possible ostomy.  The patient voiced understanding wishes to proceed.  Axel Filler, MD 08/18/2020, 11:30 PM

## 2020-08-19 ENCOUNTER — Other Ambulatory Visit: Payer: Self-pay

## 2020-08-19 ENCOUNTER — Encounter (HOSPITAL_COMMUNITY): Payer: Self-pay | Admitting: General Surgery

## 2020-08-19 DIAGNOSIS — K458 Other specified abdominal hernia without obstruction or gangrene: Secondary | ICD-10-CM | POA: Diagnosis present

## 2020-08-19 DIAGNOSIS — Z9889 Other specified postprocedural states: Secondary | ICD-10-CM

## 2020-08-19 LAB — BASIC METABOLIC PANEL
Anion gap: 8 (ref 5–15)
BUN: 10 mg/dL (ref 6–20)
CO2: 24 mmol/L (ref 22–32)
Calcium: 9.3 mg/dL (ref 8.9–10.3)
Chloride: 102 mmol/L (ref 98–111)
Creatinine, Ser: 0.68 mg/dL (ref 0.44–1.00)
GFR, Estimated: >60 mL/min (ref >60)
Glucose, Bld: 187 mg/dL — ABNORMAL HIGH (ref 70–99)
Potassium: 3.9 mmol/L (ref 3.5–5.1)
Sodium: 134 mmol/L — ABNORMAL LOW (ref 135–145)

## 2020-08-19 LAB — RESP PANEL BY RT-PCR (FLU A&B, COVID) ARPGX2
Influenza A by PCR: NEGATIVE
Influenza B by PCR: NEGATIVE
SARS Coronavirus 2 by RT PCR: NEGATIVE

## 2020-08-19 LAB — CBC
HCT: 38.3 % (ref 36.0–46.0)
Hemoglobin: 11.7 g/dL — ABNORMAL LOW (ref 12.0–15.0)
MCH: 21.8 pg — ABNORMAL LOW (ref 26.0–34.0)
MCHC: 30.5 g/dL (ref 30.0–36.0)
MCV: 71.5 fL — ABNORMAL LOW (ref 80.0–100.0)
Platelets: 279 10*3/uL (ref 150–400)
RBC: 5.36 MIL/uL — ABNORMAL HIGH (ref 3.87–5.11)
RDW: 19 % — ABNORMAL HIGH (ref 11.5–15.5)
WBC: 13.7 10*3/uL — ABNORMAL HIGH (ref 4.0–10.5)
nRBC: 0 % (ref 0.0–0.2)

## 2020-08-19 MED ORDER — ONDANSETRON 4 MG PO TBDP: 4.0000 mg | ORAL_TABLET | Freq: Four times a day (QID) | ORAL | Status: DC | PRN

## 2020-08-19 MED ORDER — ACETAMINOPHEN 325 MG PO TABS
650.0000 mg | ORAL_TABLET | Freq: Four times a day (QID) | ORAL | Status: DC
  Administered 2020-08-19 – 2020-08-23 (×17): 650 mg via ORAL
  Filled 2020-08-19 (×17): qty 2

## 2020-08-19 MED ORDER — MIDAZOLAM HCL 2 MG/2ML IJ SOLN
INTRAMUSCULAR | Status: DC | PRN
  Administered 2020-08-18: 2 mg via INTRAVENOUS

## 2020-08-19 MED ORDER — HYDROMORPHONE HCL 1 MG/ML IJ SOLN
INTRAMUSCULAR | Status: AC
  Filled 2020-08-19: qty 1

## 2020-08-19 MED ORDER — MIDAZOLAM HCL 2 MG/2ML IJ SOLN: 0.5000 mg | Freq: Once | INTRAMUSCULAR | Status: DC | PRN

## 2020-08-19 MED ORDER — ARTIFICIAL TEARS OPHTHALMIC OINT
TOPICAL_OINTMENT | OPHTHALMIC | Status: AC
  Filled 2020-08-19: qty 7

## 2020-08-19 MED ORDER — OXYCODONE HCL 5 MG/5ML PO SOLN: 5.0000 mg | Freq: Once | ORAL | Status: DC | PRN

## 2020-08-19 MED ORDER — LIDOCAINE 2% (20 MG/ML) 5 ML SYRINGE
INTRAMUSCULAR | Status: DC | PRN
  Administered 2020-08-19: 60 mg via INTRAVENOUS

## 2020-08-19 MED ORDER — 0.9 % SODIUM CHLORIDE (POUR BTL) OPTIME
TOPICAL | Status: DC | PRN
  Administered 2020-08-19: 2000 mL

## 2020-08-19 MED ORDER — ONDANSETRON HCL 4 MG/2ML IJ SOLN: 4.0000 mg | Freq: Four times a day (QID) | INTRAMUSCULAR | Status: DC | PRN

## 2020-08-19 MED ORDER — EPHEDRINE 5 MG/ML INJ
INTRAVENOUS | Status: AC
  Filled 2020-08-19: qty 30

## 2020-08-19 MED ORDER — FENTANYL CITRATE (PF) 250 MCG/5ML IJ SOLN
INTRAMUSCULAR | Status: DC | PRN
  Administered 2020-08-18 – 2020-08-19 (×2): 100 ug via INTRAVENOUS

## 2020-08-19 MED ORDER — ARTIFICIAL TEARS OPHTHALMIC OINT
TOPICAL_OINTMENT | OPHTHALMIC | Status: DC | PRN
  Filled 2020-08-19: qty 3.5

## 2020-08-19 MED ORDER — ENOXAPARIN SODIUM 40 MG/0.4ML IJ SOSY
40.0000 mg | PREFILLED_SYRINGE | INTRAMUSCULAR | Status: DC
  Administered 2020-08-20 – 2020-08-23 (×4): 40 mg via SUBCUTANEOUS
  Filled 2020-08-19 (×4): qty 0.4

## 2020-08-19 MED ORDER — DEXAMETHASONE SODIUM PHOSPHATE 10 MG/ML IJ SOLN
INTRAMUSCULAR | Status: AC
  Filled 2020-08-19: qty 4

## 2020-08-19 MED ORDER — HYDROMORPHONE HCL 1 MG/ML IJ SOLN
0.2500 mg | INTRAMUSCULAR | Status: DC | PRN
  Administered 2020-08-19: 0.5 mg via INTRAVENOUS

## 2020-08-19 MED ORDER — PROPOFOL 10 MG/ML IV BOLUS
INTRAVENOUS | Status: DC | PRN
  Administered 2020-08-19: 200 mg via INTRAVENOUS

## 2020-08-19 MED ORDER — PROMETHAZINE HCL 25 MG/ML IJ SOLN: 6.2500 mg | INTRAMUSCULAR | Status: DC | PRN

## 2020-08-19 MED ORDER — KETOROLAC TROMETHAMINE 30 MG/ML IJ SOLN: 30.0000 mg | Freq: Four times a day (QID) | INTRAMUSCULAR | Status: DC | PRN

## 2020-08-19 MED ORDER — HYDROMORPHONE HCL 1 MG/ML IJ SOLN
1.0000 mg | INTRAMUSCULAR | Status: DC | PRN
  Administered 2020-08-22: 1 mg via INTRAVENOUS
  Filled 2020-08-19: qty 1

## 2020-08-19 MED ORDER — ROCURONIUM BROMIDE 10 MG/ML (PF) SYRINGE
PREFILLED_SYRINGE | INTRAVENOUS | Status: DC | PRN
  Administered 2020-08-19: 50 mg via INTRAVENOUS

## 2020-08-19 MED ORDER — SUCCINYLCHOLINE CHLORIDE 200 MG/10ML IV SOSY
PREFILLED_SYRINGE | INTRAVENOUS | Status: AC
  Filled 2020-08-19: qty 30

## 2020-08-19 MED ORDER — OXYCODONE HCL 5 MG PO TABS: 5.0000 mg | ORAL_TABLET | Freq: Once | ORAL | Status: DC | PRN

## 2020-08-19 MED ORDER — SUGAMMADEX SODIUM 200 MG/2ML IV SOLN
INTRAVENOUS | Status: DC | PRN
  Administered 2020-08-19: 200 mg via INTRAVENOUS

## 2020-08-19 MED ORDER — SUCCINYLCHOLINE CHLORIDE 200 MG/10ML IV SOSY
PREFILLED_SYRINGE | INTRAVENOUS | Status: DC | PRN
  Administered 2020-08-19: 120 mg via INTRAVENOUS

## 2020-08-19 MED ORDER — OXYCODONE HCL 5 MG PO TABS
5.0000 mg | ORAL_TABLET | ORAL | Status: DC | PRN
  Administered 2020-08-19 – 2020-08-21 (×8): 5 mg via ORAL
  Filled 2020-08-19 (×9): qty 1

## 2020-08-19 MED ORDER — PHENYLEPHRINE HCL (PRESSORS) 10 MG/ML IV SOLN
INTRAVENOUS | Status: DC | PRN
  Administered 2020-08-19: 40 ug via INTRAVENOUS

## 2020-08-19 MED ORDER — SODIUM CHLORIDE 0.9 % IV SOLN
2.0000 g | Freq: Once | INTRAVENOUS | Status: AC
  Administered 2020-08-19: 2 g via INTRAVENOUS
  Filled 2020-08-19: qty 2

## 2020-08-19 MED ORDER — LACTATED RINGERS IV SOLN: INTRAVENOUS | Status: DC | PRN

## 2020-08-19 MED ORDER — ONDANSETRON HCL 4 MG/2ML IJ SOLN
INTRAMUSCULAR | Status: AC
  Filled 2020-08-19: qty 10

## 2020-08-19 MED ORDER — PHENOL 1.4 % MT LIQD: 1.0000 | OROMUCOSAL | Status: DC | PRN

## 2020-08-19 MED ORDER — MEPERIDINE HCL 25 MG/ML IJ SOLN: 6.2500 mg | INTRAMUSCULAR | Status: DC | PRN

## 2020-08-19 MED ORDER — ONDANSETRON HCL 4 MG/2ML IJ SOLN
INTRAMUSCULAR | Status: DC | PRN
  Administered 2020-08-19: 4 mg via INTRAVENOUS

## 2020-08-19 MED ORDER — LIDOCAINE 2% (20 MG/ML) 5 ML SYRINGE
INTRAMUSCULAR | Status: AC
  Filled 2020-08-19: qty 15

## 2020-08-19 MED ORDER — DEXAMETHASONE SODIUM PHOSPHATE 4 MG/ML IJ SOLN
INTRAMUSCULAR | Status: DC | PRN
  Administered 2020-08-19: 10 mg via INTRAVENOUS

## 2020-08-19 MED ORDER — PHENYLEPHRINE 40 MCG/ML (10ML) SYRINGE FOR IV PUSH (FOR BLOOD PRESSURE SUPPORT)
PREFILLED_SYRINGE | INTRAVENOUS | Status: AC
  Filled 2020-08-19: qty 30

## 2020-08-19 MED ORDER — ROCURONIUM BROMIDE 10 MG/ML (PF) SYRINGE
PREFILLED_SYRINGE | INTRAVENOUS | Status: AC
  Filled 2020-08-19: qty 30

## 2020-08-19 MED ORDER — DEXTROSE-NACL 5-0.9 % IV SOLN: INTRAVENOUS | Status: DC

## 2020-08-19 NOTE — Plan of Care (Signed)
  Problem: Clinical Measurements: Goal: Ability to maintain clinical measurements within normal limits will improve Outcome: Progressing Goal: Postoperative complications will be avoided or minimized Outcome: Progressing   Problem: Skin Integrity: Goal: Demonstration of wound healing without infection will improve Outcome: Progressing   

## 2020-08-19 NOTE — Op Note (Signed)
08/19/2020  1:38 AM  PATIENT:  Jenny Scott  45 y.o. female  PRE-OPERATIVE DIAGNOSIS:  Internal Hernia status post gastric bypass  POST-OPERATIVE DIAGNOSIS:  Internal Hernia status post gastric bypass  PROCEDURE:  Procedure(s): EXPLORATORY LAPAROTOMY repair of Peterson's hernia (N/A)  SURGEON:  Surgeon(s) and Role:    Axel Filler, MD - Primary  ASSISTANTS: none   ANESTHESIA:   general  EBL:  50 mL   BLOOD ADMINISTERED:none  DRAINS: none   LOCAL MEDICATIONS USED:  NONE  SPECIMEN:  No Specimen  DISPOSITION OF SPECIMEN:  N/A  COUNTS:  YES  TOURNIQUET:  * No tourniquets in log *  DICTATION: .Dragon Dictation Indication for procedure: Patient is a 45 year old female, with a history of gastric bypass surgery in 2008 with revision in January 2022.  Patient subsequently had 3 previous bouts of abdominal pain which have been short-lived.  Patient had continued abdominal pain this evening at 430 which continued and increased in severity.  Patient was seen in the ER.  Patient underwent CT scan and was found to have signs consistent with an internal hernia with questionable bowel ischemia.  Patient was taken back to the operating room for emergent laparotomy.  Findings: Patient had approximately 500 cc of lymphatic ascites within her abdominal cavity.  There was significant mount of lymphatic congestion within the mesentery.  There was a internal hernia with the majority of the bowel herniating through the jejunojejunostomy anastomosis.  This area was untwisted and the bowel was straightened. Patient did have a slight twist of her jejunojejunostomy from the Roux limb.  Patient did have a previous anastomosis proximal to this approximately 15 to 20 cm from the biliary limb.  It appeared that the patient had her Roux limb revised by creating a distal anastomosis of the Roux limb. A piece of Vicryl mesh was sewn in place to help with close off the internal hernia defect.  This  was attached to the mesentery using interrupted 3 oh silks.  The bowel was healthy throughout the entirety of the operation.  There is no ischemia.  There was an easy transition point that was seen secondary to the internal hernia.  Details of procedure: Of the patient consented she was taken back to the OR placed in supine position with bilateral SCDs in place.  Patient underwent general endotracheal intubation.  Patient was then prepped and draped in standard fashion.  A timeout was called and all facts were verified.  At this time an upper midline incision was made using #10 blade.  Cautery was used to maintain hemostasis and dissection was taken down to the linea alba.  This was incised.  The peritoneum was entered bluntly.  Significant mount of lymph ascites was encountered.  This was suctioned out.  The fascia was extended to like the skin incision.  At this time it was apparent there was significant mount of lymphatic congestion of the mesentery of the bowel.  The mesentery appeared white.  At this time it was apparent there was a internal hernia.  The small bowel was reduced through the internal hernia.  At this time I was able to trace the ligament of Treitz distally.  There was an anastomosis approximately 100 cm from the ligament of Treitz.  This appeared to be a previous anastomosis from her original operation and bypass procedure.  Patient distal to this approximately 15 to 20 cm had a jejunojejunostomy.  There is a small, 90 degree twist in this area.  However  there was no overt ischemia or obstruction in this area.  The mesentery of the small bowel appeared to be hemorrhagic however there was no ischemia.  There was a easily seen transition point of the small bowel.   Intraoperatively I discussed the case with Dr. Andrey Campanile.  At this time I decided to close the [facility] defect using a piece of Vicryl mesh.  This was sewn to the mesentery using interrupted silks.  At this time although to  visualize the jejunojejunostomy.  There was a small twist again however it was not obstructive.  At this time the abdominal cavity was irrigated out with sterile saline.  The midline fascia was reapproximated #1 PDS in a single stranded, running fashion.  Skin staples were used to approximate the skin.  Honeycomb dressing was placed.  Patient tolerated procedure well was taken to the recovery in stable condition.     PLAN OF CARE: Admit to inpatient   PATIENT DISPOSITION:  PACU - hemodynamically stable.   Delay start of Pharmacological VTE agent (>24hrs) due to surgical blood loss or risk of bleeding: yes

## 2020-08-19 NOTE — Anesthesia Procedure Notes (Signed)
Procedure Name: Intubation Date/Time: 08/19/2020 12:30 AM Performed by: Claudina Lick, CRNA Pre-anesthesia Checklist: Patient identified, Emergency Drugs available, Suction available and Patient being monitored Patient Re-evaluated:Patient Re-evaluated prior to induction Oxygen Delivery Method: Circle system utilized Preoxygenation: Pre-oxygenation with 100% oxygen Induction Type: IV induction, Rapid sequence and Cricoid Pressure applied Laryngoscope Size: Miller and 2 Grade View: Grade I Tube type: Oral Number of attempts: 1 Airway Equipment and Method: Stylet Placement Confirmation: ETT inserted through vocal cords under direct vision, positive ETCO2 and breath sounds checked- equal and bilateral Secured at: 22 cm Tube secured with: Tape Dental Injury: Teeth and Oropharynx as per pre-operative assessment

## 2020-08-19 NOTE — Transfer of Care (Signed)
Immediate Anesthesia Transfer of Care Note  Patient: Jenny Scott  Procedure(s) Performed: EXPLORATORY LAPAROTOMY repair of peters hernia (Abdomen)  Patient Location: PACU  Anesthesia Type:General  Level of Consciousness: drowsy  Airway & Oxygen Therapy: Patient Spontanous Breathing and Patient connected to face mask oxygen  Post-op Assessment: Report given to RN and Post -op Vital signs reviewed and stable  Post vital signs: Reviewed and stable  Last Vitals:  Vitals Value Taken Time  BP 125/87 08/19/20 0158  Temp    Pulse    Resp 25 08/19/20 0159  SpO2    Vitals shown include unvalidated device data.  Last Pain:  Vitals:   08/18/20 2339  TempSrc:   PainSc: 4          Complications: No notable events documented.

## 2020-08-19 NOTE — Progress Notes (Signed)
NGT and Foley removed per orders. Pt tolerated well.

## 2020-08-19 NOTE — Progress Notes (Signed)
[facility] Progress Note  1 Day Post-Op  Subjective: CC: discomfort from NG tube.  Denies significant abd pain - reports mild pain when getting OOB or moving. Denies nausea, belching, vomiting. Endorses flatus. Denies BM yet. Has already walked the halls. Reports feeling thirsty.   Objective: Vital signs in last 24 hours: Temp:  [96.6 F (35.9 C)-97.8 F (36.6 C)] 96.6 F (35.9 C) (06/24 0306) Pulse Rate:  [63-83] 83 (06/24 0306) Resp:  [14-26] 14 (06/24 0306) BP: (125-178)/(48-106) 153/76 (06/24 0306) SpO2:  [98 %-100 %] 100 % (06/24 0306)    Intake/Output from previous day: 06/23 0701 - 06/24 0700 In: 1800 [I.V.:1700; IV Piggyback:100] Out: 1300 [Urine:1250; Blood:50] Intake/Output this shift: Total I/O In: -  Out: 1300 [Urine:1300]  PE: Gen:  Alert, NAD, pleasant and cooperative Card:  Regular rate and rhythm Pulm:  Normal effort ORA Abd: Soft, appropriately tender, non-distended, midline incisions with some sanguinous strikethorugh on dressing but no active bleeding.  Skin: warm and dry, no rashes  Psych: A&Ox3   Lab Results:  Recent Labs    08/18/20 1941 08/19/20 0542  WBC 5.9 13.7*  HGB 10.2* 11.7*  HCT 34.5* 38.3  PLT 297 279   BMET Recent Labs    08/18/20 1941 08/19/20 0542  NA 137 134*  K 3.7 3.9  CL 105 102  CO2 23 24  GLUCOSE 163* 187*  BUN 15 10  CREATININE 0.75 0.68  CALCIUM 9.2 9.3   PT/INR No results for input(s): LABPROT, INR in the last 72 hours. CMP     Component Value Date/Time   NA 134 (L) 08/19/2020 0542   NA 141 10/27/2014 1239   K 3.9 08/19/2020 0542   CL 102 08/19/2020 0542   CO2 24 08/19/2020 0542   GLUCOSE 187 (H) 08/19/2020 0542   BUN 10 08/19/2020 0542   BUN 14 10/27/2014 1239   CREATININE 0.68 08/19/2020 0542   CALCIUM 9.3 08/19/2020 0542   PROT 7.3 08/18/2020 1941   PROT 6.7 10/27/2014 1239   ALBUMIN 4.4 08/18/2020 1941   ALBUMIN 4.2 10/27/2014 1239   AST 23 08/18/2020 1941   ALT 17 08/18/2020  1941   ALKPHOS 64 08/18/2020 1941   BILITOT 0.3 08/18/2020 1941   BILITOT 0.4 10/27/2014 1239   GFRNONAA >60 08/19/2020 0542   GFRAA 112 10/27/2014 1239   Lipase     Component Value Date/Time   LIPASE 36 08/18/2020 1941       Studies/Results: CT Abdomen Pelvis W Contrast  Result Date: 08/18/2020 CLINICAL DATA:  Nonlocalized acute abdominal pain. Contrast Patient here with reports of sudden onset abdominal pain along with nausea and diarrhea. Patient states hx of hernia that was not repaired. EXAM: CT ABDOMEN AND PELVIS WITH CONTRAST TECHNIQUE: Multidetector CT imaging of the abdomen and pelvis was performed using the standard protocol following bolus administration of intravenous contrast. CONTRAST:  OMNIPAQUE IOHEXOL 300 MG/ML  SOLN COMPARISON:  None. FINDINGS: Lower chest: No acute abnormality. Hepatobiliary: No focal liver abnormality. No gallstones, gallbladder wall thickening, or pericholecystic fluid. No biliary dilatation. Pancreas: No focal lesion. Normal pancreatic contour. No surrounding inflammatory changes. No main pancreatic ductal dilatation. Spleen: Normal in size without focal abnormality. Adrenals/Urinary Tract: No adrenal nodule bilaterally. Bilateral kidneys enhance symmetrically. Subcentimeter hypodensities are too small to characterize. No hydronephrosis. No hydroureter. The urinary bladder is unremarkable. 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 Stomach is within normal limits. Long segment of small bowel dilated with fecalized material with a  proximal and distal transition point noted consistent with a closed loop obstruction (33, 6:40). Associated mesenteric swirling within the left upper abdomen (3:40-49). Several nonenhancing loops of collapsed small bowel in the setting of marked mesenteric edema. Question pneumatosis (3:63). The large bowel is completely collapsed. The appendix not definitely identified. Vascular/Lymphatic: No portal  venous or mesenteric venous gas identified. No abdominal aorta or iliac aneurysm. Question prominent mesenteric lymph nodes. No abdominal, pelvic, or inguinal lymphadenopathy. Reproductive: Uterus and bilateral adnexa are unremarkable. Other: Small volume free fluid ascites. No intraperitoneal free gas. No organized fluid collection. Musculoskeletal: No abdominal wall hernia or abnormality. No suspicious lytic or blastic osseous lesions. No acute displaced fracture. L5-S1 degenerative changes. IMPRESSION: Long segment closed loop small bowel obstruction with associated small bowel ischemia. At least small volume ascites and associated marked mesenteric edema. Recommend emergent surgical consult. These results were called by telephone at the time of interpretation on 08/18/2020 at 10:54 pm to provider PA [facility] , who verbally acknowledged these results. Electronically Signed   By: Tish Frederickson M.D.   On: 08/18/2020 23:02    Anti-infectives: Anti-infectives (From admission, onward)    Start     Dose/Rate Route Frequency Ordered Stop   08/19/20 0030  cefoTEtan (CEFOTAN) 2 g in sodium chloride 0.9 % 100 mL IVPB        2 g 200 mL/hr over 30 Minutes Intravenous  Once 08/19/20 0024 08/19/20 0112       Assessment/Plan PMH Roux-en-Y gastric bypass 2008 PMH closed loop SBO through peterson's defect s/p dx lap converted to open, reduction internal hernia, closure of defect 08/23/13 at [facility] by Dr. Buford Dresser  Patient-reported revision of gastric bypass surgery in January 2022.   Internal hernia  POD#0 s/p exploratory laparotomy repair of peterson's hernia with vicryl mesh 08/19/20 Dr. Derrell Lolling - afebrile, VSS  - D/C NGT and allow CLD  - having flatus, await further ROBF - PO pain control  - IS, Mobilize   FEN: CLD ID: peroperative ancef x 1 dose VTE: SCD's, lovenox Foley: remove today Dispo: med-surg EDD: 6/25 or 6/26     LOS: 0 days    Hosie Spangle, [facility]  Surgery Please see Amion for pager number during day hours 7:00am-4:30pm

## 2020-08-19 NOTE — Progress Notes (Signed)
Notified Dr Derrell Lolling that  pt right eye is red. She c/o her eye being scratchy and uncomfortable. She is requesting an eyedrop. Rn will continue to monitor.

## 2020-08-19 NOTE — Anesthesia Postprocedure Evaluation (Signed)
Anesthesia Post Note  Patient: Jenny Scott  Procedure(s) Performed: EXPLORATORY LAPAROTOMY repair of peters hernia (Abdomen)     Patient location during evaluation: PACU Anesthesia Type: General Level of consciousness: patient cooperative, oriented and sedated Pain management: pain level controlled Vital Signs Assessment: post-procedure vital signs reviewed and stable Respiratory status: spontaneous breathing, nonlabored ventilation, respiratory function stable and patient connected to nasal cannula oxygen Cardiovascular status: blood pressure returned to baseline and stable Postop Assessment: no apparent nausea or vomiting Anesthetic complications: no   No notable events documented.  Last Vitals:  Vitals:   08/19/20 0245 08/19/20 0306  BP: 138/60 (!) 153/76  Pulse: 79 83  Resp: (!) 21 14  Temp: (!) 36.3 C (!) 35.9 C  SpO2: 100% 100%    Last Pain:  Vitals:   08/19/20 0230  TempSrc:   PainSc: 8                  Omaya Nieland,E. Jibran Crookshanks

## 2020-08-20 NOTE — Plan of Care (Signed)
VSS. Ambulating to BR. C/o abd pain, given PRN medication. Call bell within reach.   Problem: Clinical Measurements: Goal: Ability to maintain clinical measurements within normal limits will improve Outcome: Progressing Goal: Postoperative complications will be avoided or minimized Outcome: Progressing   Problem: Skin Integrity: Goal: Demonstration of wound healing without infection will improve Outcome: Progressing   Problem: Activity: Goal: Risk for activity intolerance will decrease Outcome: Progressing   Problem: Nutrition: Goal: Adequate nutrition will be maintained Outcome: Progressing   Problem: Elimination: Goal: Will not experience complications related to bowel motility Outcome: Progressing Goal: Will not experience complications related to urinary retention Outcome: Progressing   Problem: Pain Managment: Goal: General experience of comfort will improve Outcome: Progressing

## 2020-08-20 NOTE — Progress Notes (Signed)
2 Days Post-Op   Subjective/Chief Complaint: No complaints but hasn't had flatus or bm yet   Objective: Vital signs in last 24 hours: Temp:  [98.3 F (36.8 C)-98.7 F (37.1 C)] 98.7 F (37.1 C) (06/25 0700) Pulse Rate:  [91-109] 91 (06/25 0700) Resp:  [15-17] 17 (06/25 0700) BP: (142-146)/(39-60) 146/54 (06/25 0700) SpO2:  [100 %] 100 % (06/25 0700) Weight:  [78.6 kg] 78.6 kg (06/24 2033)    Intake/Output from previous day: 06/24 0701 - 06/25 0700 In: 1141 [I.V.:1141] Out: 1300 [Urine:1300] Intake/Output this shift: No intake/output data recorded.  General appearance: alert and cooperative Resp: clear to auscultation bilaterally Cardio: regular rate and rhythm GI: soft, minimal tenderness. Quiet. Incision ok  Lab Results:  Recent Labs    08/18/20 1941 08/19/20 0542  WBC 5.9 13.7*  HGB 10.2* 11.7*  HCT 34.5* 38.3  PLT 297 279   BMET Recent Labs    08/18/20 1941 08/19/20 0542  NA 137 134*  K 3.7 3.9  CL 105 102  CO2 23 24  GLUCOSE 163* 187*  BUN 15 10  CREATININE 0.75 0.68  CALCIUM 9.2 9.3   PT/INR No results for input(s): LABPROT, INR in the last 72 hours. ABG No results for input(s): PHART, HCO3 in the last 72 hours.  Invalid input(s): PCO2, PO2  Studies/Results: CT Abdomen Pelvis W Contrast  Result Date: 08/18/2020 CLINICAL DATA:  Nonlocalized acute abdominal pain. Contrast Patient here with reports of sudden onset abdominal pain along with nausea and diarrhea. Patient states hx of hernia that was not repaired. EXAM: CT ABDOMEN AND PELVIS WITH CONTRAST TECHNIQUE: Multidetector CT imaging of the abdomen and pelvis was performed using the standard protocol following bolus administration of intravenous contrast. CONTRAST:  OMNIPAQUE IOHEXOL 300 MG/ML  SOLN COMPARISON:  None. FINDINGS: Lower chest: No acute abnormality. Hepatobiliary: No focal liver abnormality. No gallstones, gallbladder wall thickening, or pericholecystic fluid. No biliary  dilatation. Pancreas: No focal lesion. Normal pancreatic contour. No surrounding inflammatory changes. No main pancreatic ductal dilatation. Spleen: Normal in size without focal abnormality. Adrenals/Urinary Tract: No adrenal nodule bilaterally. Bilateral kidneys enhance symmetrically. Subcentimeter hypodensities are too small to characterize. No hydronephrosis. No hydroureter. The urinary bladder is unremarkable. 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 Stomach is within normal limits. Long segment of small bowel dilated with fecalized material with a proximal and distal transition point noted consistent with a closed loop obstruction (33, 6:40). Associated mesenteric swirling within the left upper abdomen (3:40-49). Several nonenhancing loops of collapsed small bowel in the setting of marked mesenteric edema. Question pneumatosis (3:63). The large bowel is completely collapsed. The appendix not definitely identified. Vascular/Lymphatic: No portal venous or mesenteric venous gas identified. No abdominal aorta or iliac aneurysm. Question prominent mesenteric lymph nodes. No abdominal, pelvic, or inguinal lymphadenopathy. Reproductive: Uterus and bilateral adnexa are unremarkable. Other: Small volume free fluid ascites. No intraperitoneal free gas. No organized fluid collection. Musculoskeletal: No abdominal wall hernia or abnormality. No suspicious lytic or blastic osseous lesions. No acute displaced fracture. L5-S1 degenerative changes. IMPRESSION: Long segment closed loop small bowel obstruction with associated small bowel ischemia. At least small volume ascites and associated marked mesenteric edema. Recommend emergent surgical consult. These results were called by telephone at the time of interpretation on 08/18/2020 at 10:54 pm to provider PA [facility] , who verbally acknowledged these results. Electronically Signed   By: Tish Frederickson M.D.   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 Buford Dresser Patient-reported revision of gastric bypass surgery in January 2022.   Internal hernia POD#2 s/p exploratory laparotomy repair of peterson's hernia with vicryl mesh 08/19/20 Dr. Derrell Lolling - afebrile, VSS - D/C NGT and allow CLD - not having flatus, await further ROBF - PO pain control - IS, Mobilize   FEN: CLD ID: peroperative ancef x 1 dose VTE: SCD's, lovenox Foley: remove today Dispo: med-surg  LOS: 1 day    Jenny Scott 08/20/2020

## 2020-08-21 NOTE — Plan of Care (Signed)
  Problem: Education: Goal: Required Educational Video(s) Outcome: Progressing   Problem: Clinical Measurements: Goal: Ability to maintain clinical measurements within normal limits will improve Outcome: Progressing   Problem: Health Behavior/Discharge Planning: Goal: Ability to manage health-related needs will improve Outcome: Progressing   Problem: Clinical Measurements: Goal: Ability to maintain clinical measurements within normal limits will improve Outcome: Progressing   Problem: Nutrition: Goal: Adequate nutrition will be maintained Outcome: Progressing   Problem: Coping: Goal: Level of anxiety will decrease Outcome: Progressing   Problem: Elimination: Goal: Will not experience complications related to bowel motility Outcome: Progressing   Problem: Pain Managment: Goal: General experience of comfort will improve Outcome: Progressing   Problem: Safety: Goal: Ability to remain free from injury will improve Outcome: Progressing

## 2020-08-21 NOTE — Progress Notes (Signed)
3 Days Post-Op   Subjective/Chief Complaint: Feels better today. Passing some flatus and small bm   Objective: Vital signs in last 24 hours: Temp:  [98 F (36.7 C)-98.7 F (37.1 C)] 98 F (36.7 C) (06/26 0733) Pulse Rate:  [78-97] 78 (06/26 0733) Resp:  [17-19] 17 (06/26 0733) BP: (98-162)/(56-79) 162/68 (06/26 0733) SpO2:  [100 %] 100 % (06/26 0733)    Intake/Output from previous day: 06/25 0701 - 06/26 0700 In: 600 [P.O.:600] Out: -  Intake/Output this shift: No intake/output data recorded.  General appearance: alert and cooperative Resp: clear to auscultation bilaterally Cardio: regular rate and rhythm GI: soft, nontender  Lab Results:  Recent Labs    08/18/20 1941 08/19/20 0542  WBC 5.9 13.7*  HGB 10.2* 11.7*  HCT 34.5* 38.3  PLT 297 279   BMET Recent Labs    08/18/20 1941 08/19/20 0542  NA 137 134*  K 3.7 3.9  CL 105 102  CO2 23 24  GLUCOSE 163* 187*  BUN 15 10  CREATININE 0.75 0.68  CALCIUM 9.2 9.3   PT/INR No results for input(s): LABPROT, INR in the last 72 hours. ABG No results for input(s): PHART, HCO3 in the last 72 hours.  Invalid input(s): PCO2, PO2  Studies/Results: No results found.  Anti-infectives: Anti-infectives (From admission, onward)    Start     Dose/Rate Route Frequency Ordered Stop   08/19/20 0030  cefoTEtan (CEFOTAN) 2 g in sodium chloride 0.9 % 100 mL IVPB        2 g 200 mL/hr over 30 Minutes Intravenous  Once 08/19/20 0024 08/19/20 0112       Assessment/Plan: s/p Procedure(s): EXPLORATORY LAPAROTOMY (N/A) REPAIR OF PETERSON HERNIA (N/A) Advance diet PMH Roux-en-Y gastric bypass 2008 PMH closed loop SBO through peterson's defect s/p dx lap converted to open, reduction internal hernia, closure of defect 08/23/13 at [facility] by Dr. Buford Dresser Patient-reported revision of gastric bypass surgery in January 2022.   Internal hernia POD#3 s/p exploratory laparotomy repair of peterson's hernia with vicryl mesh 08/19/20  Dr. Derrell Lolling - afebrile, VSS - D/C NGT and allow CLD - advance diet - PO pain control - IS, Mobilize   FEN: fulls ID: peroperative ancef x 1 dose VTE: SCD's, lovenox Foley: remove today Dispo: med-surg  LOS: 2 days    Jenny Scott III 08/21/2020

## 2020-08-21 NOTE — Progress Notes (Signed)
Pt was able to tolerate full liquid diet, advanced diet per order.

## 2020-08-22 MED ORDER — OXYCODONE HCL 5 MG PO TABS: 5.0000 mg | ORAL_TABLET | ORAL | Status: DC | PRN

## 2020-08-22 MED ORDER — HYDROMORPHONE HCL 1 MG/ML IJ SOLN: 1.0000 mg | INTRAMUSCULAR | Status: DC | PRN

## 2020-08-22 MED ORDER — METHOCARBAMOL 750 MG PO TABS
750.0000 mg | ORAL_TABLET | Freq: Four times a day (QID) | ORAL | Status: DC
  Administered 2020-08-22 (×3): 750 mg via ORAL
  Filled 2020-08-22 (×4): qty 1

## 2020-08-22 NOTE — Plan of Care (Signed)
  Problem: Education: Goal: Required Educational Video(s) 08/22/2020 1057 by Quintella Baton, RN Outcome: Adequate for Discharge 08/22/2020 1056 by Garlon Hatchet A, RN Outcome: Progressing   Problem: Clinical Measurements: Goal: Ability to maintain clinical measurements within normal limits will improve 08/22/2020 1057 by Quintella Baton, RN Outcome: Adequate for Discharge 08/22/2020 1056 by Garlon Hatchet A, RN Outcome: Progressing Goal: Postoperative complications will be avoided or minimized 08/22/2020 1057 by Quintella Baton, RN Outcome: Adequate for Discharge 08/22/2020 1056 by Garlon Hatchet A, RN Outcome: Progressing   Problem: Skin Integrity: Goal: Demonstration of wound healing without infection will improve 08/22/2020 1057 by Quintella Baton, RN Outcome: Adequate for Discharge 08/22/2020 1056 by Quintella Baton, RN Outcome: Progressing   Problem: Education: Goal: Knowledge of General Education information will improve Description: Including pain rating scale, medication(s)/side effects and non-pharmacologic comfort measures 08/22/2020 1057 by Quintella Baton, RN Outcome: Adequate for Discharge 08/22/2020 1056 by Quintella Baton, RN Outcome: Progressing   Problem: Health Behavior/Discharge Planning: Goal: Ability to manage health-related needs will improve 08/22/2020 1057 by Quintella Baton, RN Outcome: Adequate for Discharge 08/22/2020 1056 by Garlon Hatchet A, RN Outcome: Progressing   Problem: Clinical Measurements: Goal: Ability to maintain clinical measurements within normal limits will improve 08/22/2020 1057 by Quintella Baton, RN Outcome: Adequate for Discharge 08/22/2020 1056 by Garlon Hatchet A, RN Outcome: Progressing Goal: Will remain free from infection 08/22/2020 1057 by Quintella Baton, RN Outcome: Adequate for Discharge 08/22/2020 1056 by Garlon Hatchet A, RN Outcome: Progressing Goal: Diagnostic test results will improve 08/22/2020 1057 by  Quintella Baton, RN Outcome: Adequate for Discharge 08/22/2020 1056 by Garlon Hatchet A, RN Outcome: Progressing Goal: Respiratory complications will improve 08/22/2020 1057 by Quintella Baton, RN Outcome: Adequate for Discharge 08/22/2020 1056 by Garlon Hatchet A, RN Outcome: Progressing Goal: Cardiovascular complication will be avoided 08/22/2020 1057 by Quintella Baton, RN Outcome: Adequate for Discharge 08/22/2020 1056 by Quintella Baton, RN Outcome: Progressing   Problem: Activity: Goal: Risk for activity intolerance will decrease 08/22/2020 1057 by Quintella Baton, RN Outcome: Adequate for Discharge 08/22/2020 1056 by Garlon Hatchet A, RN Outcome: Progressing   Problem: Nutrition: Goal: Adequate nutrition will be maintained 08/22/2020 1057 by Quintella Baton, RN Outcome: Adequate for Discharge 08/22/2020 1056 by Quintella Baton, RN Outcome: Progressing   Problem: Coping: Goal: Level of anxiety will decrease 08/22/2020 1057 by Quintella Baton, RN Outcome: Adequate for Discharge 08/22/2020 1056 by Garlon Hatchet A, RN Outcome: Progressing   Problem: Elimination: Goal: Will not experience complications related to bowel motility 08/22/2020 1057 by Quintella Baton, RN Outcome: Adequate for Discharge 08/22/2020 1056 by Quintella Baton, RN Outcome: Progressing Goal: Will not experience complications related to urinary retention 08/22/2020 1057 by Quintella Baton, RN Outcome: Adequate for Discharge 08/22/2020 1056 by Quintella Baton, RN Outcome: Progressing   Problem: Pain Managment: Goal: General experience of comfort will improve 08/22/2020 1057 by Quintella Baton, RN Outcome: Adequate for Discharge 08/22/2020 1056 by Garlon Hatchet A, RN Outcome: Progressing   Problem: Safety: Goal: Ability to remain free from injury will improve 08/22/2020 1057 by Quintella Baton, RN Outcome: Adequate for Discharge 08/22/2020 1056 by Garlon Hatchet A, RN Outcome: Progressing    Problem: Skin Integrity: Goal: Risk for impaired skin integrity will decrease 08/22/2020 1057 by Quintella Baton, RN Outcome: Adequate for Discharge 08/22/2020 1056 by Quintella Baton, RN Outcome: Progressing

## 2020-08-22 NOTE — Plan of Care (Signed)
  Problem: Education: Goal: Required Educational Video(s) 08/22/2020 0045 by Dahlia Bailiff, RN Outcome: Adequate for Discharge 08/22/2020 0010 by Dahlia Bailiff, RN Outcome: Adequate for Discharge   Problem: Clinical Measurements: Goal: Ability to maintain clinical measurements within normal limits will improve 08/22/2020 0045 by Dahlia Bailiff, RN Outcome: Adequate for Discharge 08/22/2020 0010 by Dahlia Bailiff, RN Outcome: Adequate for Discharge Goal: Postoperative complications will be avoided or minimized 08/22/2020 0045 by Dahlia Bailiff, RN Outcome: Adequate for Discharge 08/22/2020 0010 by Dahlia Bailiff, RN Outcome: Adequate for Discharge

## 2020-08-22 NOTE — Discharge Instructions (Signed)
[facility] [phone]  OPEN ABDOMINAL SURGERY: POST OP INSTRUCTIONS  Always review your discharge instruction sheet given to you by the facility where your surgery was performed.  IF YOU HAVE DISABILITY OR FAMILY LEAVE FORMS, YOU MUST BRING THEM TO THE OFFICE FOR PROCESSING.  PLEASE DO NOT GIVE THEM TO YOUR DOCTOR.  A prescription for pain medication may be given to you upon discharge.  Take your pain medication as prescribed, if needed.  If narcotic pain medicine is not needed, then you may take acetaminophen (Tylenol) or ibuprofen (Advil) as needed. Take your usually prescribed medications unless otherwise directed. If you need a refill on your pain medication, please contact your pharmacy. They will contact our office to request authorization.  Prescriptions will not be filled after 5pm or on week-ends. You should follow a light diet the first few days after arrival home, such as soup and crackers, pudding, etc.unless your doctor has advised otherwise. A high-fiber, low fat diet can be resumed as tolerated.   Be sure to include lots of fluids daily. Most patients will experience some swelling and bruising on the chest and neck area.  Ice packs will help.  Swelling and bruising can take several days to resolve Most patients will experience some swelling and bruising in the area of the incision. Ice pack will help. Swelling and bruising can take several days to resolve..  It is common to experience some constipation if taking pain medication after surgery.  Increasing fluid intake and taking a stool softener will usually help or prevent this problem from occurring.  A mild laxative (Milk of Magnesia or Miralax) should be taken according to package directions if there are no bowel movements after 48 hours.  You may have steri-strips (small skin tapes) in place directly over the incision.  These strips should be left on the skin for 7-10 days.  If your surgeon used skin  glue on the incision, you may shower in 24 hours.  The glue will flake off over the next 2-3 weeks.  Any sutures or staples will be removed at the office during your follow-up visit. You may find that a light gauze bandage over your incision may keep your staples from being rubbed or pulled. You may shower and replace the bandage daily. ACTIVITIES:  You may resume regular (light) daily activities beginning the next day--such as daily self-care, walking, climbing stairs--gradually increasing activities as tolerated.  You may have sexual intercourse when it is comfortable.  Refrain from any heavy lifting or straining until approved by your doctor. You may drive when you no longer are taking prescription pain medication, you can comfortably wear a seatbelt, and you can safely maneuver your car and apply brakes Return to Work: ___________________________________ You should see your doctor in the office for a follow-up appointment approximately two weeks after your surgery.  Make sure that you call for this appointment within a day or two after you arrive home to insure a convenient appointment time. OTHER INSTRUCTIONS:  _____________________________________________________________ _____________________________________________________________  WHEN TO CALL YOUR DOCTOR: Fever over 101.0 Inability to urinate Nausea and/or vomiting Extreme swelling or bruising Continued bleeding from incision. Increased pain, redness, or drainage from the incision. Difficulty swallowing or breathing Muscle cramping or spasms. Numbness or tingling in hands or feet or around lips.  The clinic staff is available to answer your questions during regular business hours.  Please don't hesitate to call and ask to speak to one of   the nurses if you have concerns.  For further questions, please visit [email or web address]  

## 2020-08-22 NOTE — Plan of Care (Signed)

## 2020-08-22 NOTE — Plan of Care (Signed)
  Problem: Education: Goal: Required Educational Video(s) Outcome: Adequate for Discharge   Problem: Clinical Measurements: Goal: Ability to maintain clinical measurements within normal limits will improve Outcome: Adequate for Discharge Goal: Postoperative complications will be avoided or minimized Outcome: Adequate for Discharge

## 2020-08-22 NOTE — Progress Notes (Signed)
Progress Note  4 Days Post-Op  Subjective: CC: has a headache similar to those she has had in the past and abdominal pain consistent with current menses. Surgical pain well controlled. Tolerating soft diet without nausea, emesis, or abdominal pain. Passing flatus. No BM. She has ambulated only within her room  Objective: Vital signs in last 24 hours: Temp:  [98.1 F (36.7 C)-99.3 F (37.4 C)] 98.3 F (36.8 C) (06/27 0721) Pulse Rate:  [78-98] 78 (06/27 0721) Resp:  [16-18] 18 (06/27 0721) BP: (131-157)/(48-65) 135/48 (06/27 0721) SpO2:  [100 %] 100 % (06/27 0721)    Intake/Output from previous day: 06/26 0701 - 06/27 0700 In: 2636.1 [P.O.:480; I.V.:2156.1] Out: -  Intake/Output this shift: No intake/output data recorded.  PE: General: pleasant, WD, female who is laying in bed in NAD HEENT: head is normocephalic, atraumatic.  Pupils equal and round. Mouth is pink and moist Heart: Palpable radial and pedal pulses bilaterally Lungs: CTAB. Respiratory effort nonlabored Abd: soft, ND, +BS, appropriate post operative tenderness to palpation near incision. Staples intact with scant old blood. No erythema or discharge MS: all 4 extremities are symmetrical with no cyanosis, clubbing, or edema. Skin: warm and dry with no masses, lesions, or rashes Psych: A&Ox3 with an appropriate affect.    Lab Results:  No results for input(s): WBC, HGB, HCT, PLT in the last 72 hours. BMET No results for input(s): NA, K, CL, CO2, GLUCOSE, BUN, CREATININE, CALCIUM in the last 72 hours. PT/INR No results for input(s): LABPROT, INR in the last 72 hours. CMP     Component Value Date/Time   NA 134 (L) 08/19/2020 0542   NA 141 10/27/2014 1239   K 3.9 08/19/2020 0542   CL 102 08/19/2020 0542   CO2 24 08/19/2020 0542   GLUCOSE 187 (H) 08/19/2020 0542   BUN 10 08/19/2020 0542   BUN 14 10/27/2014 1239   CREATININE 0.68 08/19/2020 0542   CALCIUM 9.3 08/19/2020 0542   PROT 7.3 08/18/2020 1941    PROT 6.7 10/27/2014 1239   ALBUMIN 4.4 08/18/2020 1941   ALBUMIN 4.2 10/27/2014 1239   AST 23 08/18/2020 1941   ALT 17 08/18/2020 1941   ALKPHOS 64 08/18/2020 1941   BILITOT 0.3 08/18/2020 1941   BILITOT 0.4 10/27/2014 1239   GFRNONAA >60 08/19/2020 0542   GFRAA 112 10/27/2014 1239   Lipase     Component Value Date/Time   LIPASE 36 08/18/2020 1941       Studies/Results: No results found.  Anti-infectives: Anti-infectives (From admission, onward)    Start     Dose/Rate Route Frequency Ordered Stop   08/19/20 0030  cefoTEtan (CEFOTAN) 2 g in sodium chloride 0.9 % 100 mL IVPB        2 g 200 mL/hr over 30 Minutes Intravenous  Once 08/19/20 0024 08/19/20 0112        Assessment/Plan POD4 s/p Procedure(s): EXPLORATORY LAPAROTOMY (N/A) REPAIR OF PETERSON HERNIA (N/A)  PMH Roux-en-Y gastric bypass 2008 PMH closed loop SBO through peterson's defect s/p dx lap converted to open, reduction internal hernia, closure of defect 08/23/13 at [facility] by Dr. Buford Dresser Patient-reported revision of gastric bypass surgery in January 2022.   Internal hernia POD#4 s/p exploratory laparotomy repair of peterson's hernia with vicryl mesh 08/19/20 Dr. Derrell Lolling - afebrile, VSS - tolerating soft diet - PO pain control - walk in the hallways today - robaxin added for further pain control   FEN: soft ID: peroperative ancef x 1 dose VTE:  SCD's, lovenox Foley: removed Dispo: med-surg   Dispo: discharge this afternoon or tomorrow am pending pain control   LOS: 3 days    Eric Form, [facility] 08/22/2020, 9:21 AM Please see Amion for pager number during day hours 7:00am-4:30pm

## 2020-08-23 MED ORDER — OXYCODONE HCL 5 MG PO TABS: 5.0000 mg | ORAL_TABLET | Freq: Four times a day (QID) | ORAL | 0 refills | Status: AC | PRN

## 2020-08-23 MED ORDER — ACETAMINOPHEN 325 MG PO TABS: 650.0000 mg | ORAL_TABLET | Freq: Four times a day (QID) | ORAL | Status: AC | PRN

## 2020-08-23 MED ORDER — METHOCARBAMOL 750 MG PO TABS: 750.0000 mg | ORAL_TABLET | Freq: Four times a day (QID) | ORAL | 0 refills | Status: AC

## 2020-08-23 NOTE — Discharge Summary (Signed)
[facility] Discharge Summary   Patient ID: Jenny Scott MRN: [id number] DOB/AGE: Mar 02, 1975 44 y.o.  Admit date: 08/18/2020 Discharge date: 08/23/2020  Admitting Diagnosis: Abdominal pain Internal Hernia  Discharge Diagnosis Patient Active Problem List   Diagnosis Date Noted   S/P exploratory laparotomy 08/19/2020   Internal hernia 08/19/2020   History of small bowel obstruction 08/22/2013   History of bariatric surgery 02/16/2007    Consultants None   Procedures Dr. Derrell Lolling (08/19/20) - EXPLORATORY LAPAROTOMY (N/A) REPAIR OF PETERSON HERNIA (N/A)  Hospital Course:  45 yo female who presented to [facility] ED with abdominal pain, nausea, diarrhea.  Workup showed CT scan with internal hernia and possible ischemic bowel.  Patient was admitted and underwent procedure listed above.  Tolerated procedure well and was transferred to the floor.  NG tube was initially placed and later removed once return of bowel function. Diet was advanced as tolerated.  On POD5, the patient was voiding well, tolerating diet, ambulating well, pain well controlled, vital signs stable, incisions c/d/i and felt stable for discharge home.  She had ambulated in the hallways and had minimal to no abdominal pain. She denied nausea or emesis. Patient will follow up in our office in 1 week for staple weeks and 3-4 weeks for surgical follow up and knows to call with questions or concerns.    Discharged with oxycodone and robaxin to alternate with tylenol for pain control.  Physical Exam: General:  Alert, NAD, pleasant, comfortable Abd:  Soft, ND, no tenderness, incision C/D/I with staples intact  I or a member of my team have reviewed this patient in the Controlled Substance Database.   Allergies as of 08/23/2020   No Known Allergies      Medication List     TAKE these medications    acetaminophen 325 MG tablet Commonly known as: TYLENOL Take 2 tablets (650 mg total) by mouth every 6 (six)  hours as needed for mild pain or headache.   BIOTIN PO Take 3,000 mcg by mouth daily.   bismuth subsalicylate 262 MG/15ML suspension Commonly known as: PEPTO BISMOL Take 30 mLs by mouth every 6 (six) hours as needed for indigestion.   calcium citrate-vitamin D 315-200 MG-UNIT tablet Commonly known as: CITRACAL+D Take 1 tablet by mouth daily.   Magnesium 200 MG Chew Chew 400 mg by mouth daily.   methocarbamol 750 MG tablet Commonly known as: ROBAXIN Take 1 tablet (750 mg total) by mouth 4 (four) times daily.   oxyCODONE 5 MG immediate release tablet Commonly known as: Oxy IR/ROXICODONE Take 1-2 tablets (5-10 mg total) by mouth every 6 (six) hours as needed for moderate pain or severe pain (5 mg for 4-6/10 pain. 10mg  for >6/10 pain).   ZINC 15 PO Take 45 mg by mouth daily.       ASK your doctor about these medications    Tri-Lo-Marzia 0.18/0.215/0.25 MG-25 MCG tab Generic drug: Norgestimate-Ethinyl Estradiol Triphasic TAKE 1 TABLET BY MOUTH DAILY.          Follow-up Information     03-26-1982, MD Follow up in 4 week(s).   Specialty: General Surgery Why: clinic appointment on 7/27 at 1:30 pm Please arrive 15 minutes early to complete check in and bring photo id and insurance card Contact information: [address] [place] [phone]         [facility] Follow up in 1 week(s).   Specialty: General Surgery Why: nurse appointment on 7/5  at 2:30 pm for staple removal Please arrive 30 minutes early to complete check in and bring photo id and insurance card Contact information: [address] [place] [address] [phone]                Signed: Clarise Cruz [facility] 08/23/2020, 4:31 PM Please see Amion for pager number during day hours 7:00am-4:30pm

## 2020-08-23 NOTE — Progress Notes (Signed)
Discharge summary packet provided to pt by charge nurse, Per charge nurse explained /insructed to pt with no complaints . Pt d/c to home as ordered. Pt's daughter is responsible for her ride.
